# Patient Record
Sex: Female | Born: 1970
Health system: Southern US, Community
[De-identification: ages and names within clinical notes are randomized; demographics above are authoritative.]

## PROBLEM LIST (undated history)

## (undated) DIAGNOSIS — R0789 Other chest pain: Secondary | ICD-10-CM

## (undated) DIAGNOSIS — R519 Headache, unspecified: Secondary | ICD-10-CM

## (undated) DIAGNOSIS — G4730 Sleep apnea, unspecified: Secondary | ICD-10-CM

## (undated) DIAGNOSIS — E119 Type 2 diabetes mellitus without complications: Secondary | ICD-10-CM

## (undated) DIAGNOSIS — R5381 Other malaise: Secondary | ICD-10-CM

## (undated) DIAGNOSIS — T8859XA Other complications of anesthesia, initial encounter: Secondary | ICD-10-CM

## (undated) DIAGNOSIS — Z9889 Other specified postprocedural states: Secondary | ICD-10-CM

## (undated) DIAGNOSIS — R5383 Other fatigue: Secondary | ICD-10-CM

## (undated) DIAGNOSIS — M6208 Separation of muscle (nontraumatic), other site: Secondary | ICD-10-CM

## (undated) DIAGNOSIS — E785 Hyperlipidemia, unspecified: Secondary | ICD-10-CM

## (undated) DIAGNOSIS — F419 Anxiety disorder, unspecified: Secondary | ICD-10-CM

## (undated) DIAGNOSIS — F410 Panic disorder [episodic paroxysmal anxiety] without agoraphobia: Secondary | ICD-10-CM

## (undated) DIAGNOSIS — T4145XA Adverse effect of unspecified anesthetic, initial encounter: Secondary | ICD-10-CM

## (undated) DIAGNOSIS — R51 Headache: Secondary | ICD-10-CM

## (undated) DIAGNOSIS — C801 Malignant (primary) neoplasm, unspecified: Secondary | ICD-10-CM

## (undated) DIAGNOSIS — R112 Nausea with vomiting, unspecified: Secondary | ICD-10-CM

## (undated) DIAGNOSIS — E669 Obesity, unspecified: Secondary | ICD-10-CM

## (undated) DIAGNOSIS — I10 Essential (primary) hypertension: Secondary | ICD-10-CM

## (undated) DIAGNOSIS — M1990 Unspecified osteoarthritis, unspecified site: Secondary | ICD-10-CM

## (undated) DIAGNOSIS — R0683 Snoring: Secondary | ICD-10-CM

## (undated) DIAGNOSIS — E668 Other obesity: Secondary | ICD-10-CM

## (undated) HISTORY — DX: Headache: R51

## (undated) HISTORY — PX: KNEE SURGERY: SHX244

## (undated) HISTORY — DX: Other chest pain: R07.89

## (undated) HISTORY — DX: Other obesity: E66.8

## (undated) HISTORY — DX: Panic disorder (episodic paroxysmal anxiety): F41.0

## (undated) HISTORY — DX: Headache, unspecified: R51.9

## (undated) HISTORY — DX: Anxiety disorder, unspecified: F41.9

## (undated) HISTORY — DX: Hyperlipidemia, unspecified: E78.5

## (undated) HISTORY — DX: Unspecified osteoarthritis, unspecified site: M19.90

## (undated) HISTORY — DX: Other malaise: R53.81

## (undated) HISTORY — DX: Snoring: R06.83

## (undated) HISTORY — DX: Other malaise: R53.83

## (undated) HISTORY — PX: JOINT REPLACEMENT: SHX530

## (undated) HISTORY — DX: Obesity, unspecified: E66.9

## (undated) HISTORY — DX: Separation of muscle (nontraumatic), other site: M62.08

---

## 1998-11-25 ENCOUNTER — Other Ambulatory Visit: Admission: RE | Admit: 1998-11-25 | Discharge: 1998-11-25 | Payer: Self-pay | Admitting: Obstetrics and Gynecology

## 1999-11-18 ENCOUNTER — Other Ambulatory Visit: Admission: RE | Admit: 1999-11-18 | Discharge: 1999-11-18 | Payer: Self-pay | Admitting: Obstetrics and Gynecology

## 2000-12-12 ENCOUNTER — Other Ambulatory Visit: Admission: RE | Admit: 2000-12-12 | Discharge: 2000-12-12 | Payer: Self-pay | Admitting: Obstetrics and Gynecology

## 2001-04-30 ENCOUNTER — Encounter: Payer: Self-pay | Admitting: Obstetrics and Gynecology

## 2001-04-30 ENCOUNTER — Ambulatory Visit (HOSPITAL_COMMUNITY): Admission: RE | Admit: 2001-04-30 | Discharge: 2001-04-30 | Payer: Self-pay | Admitting: Obstetrics and Gynecology

## 2001-05-28 ENCOUNTER — Encounter: Payer: Self-pay | Admitting: Obstetrics and Gynecology

## 2001-05-28 ENCOUNTER — Ambulatory Visit (HOSPITAL_COMMUNITY): Admission: RE | Admit: 2001-05-28 | Discharge: 2001-05-28 | Payer: Self-pay | Admitting: Obstetrics and Gynecology

## 2001-09-26 ENCOUNTER — Inpatient Hospital Stay (HOSPITAL_COMMUNITY): Admission: AD | Admit: 2001-09-26 | Discharge: 2001-09-28 | Payer: Self-pay | Admitting: Obstetrics and Gynecology

## 2001-11-08 ENCOUNTER — Other Ambulatory Visit: Admission: RE | Admit: 2001-11-08 | Discharge: 2001-11-08 | Payer: Self-pay | Admitting: Obstetrics and Gynecology

## 2002-12-31 ENCOUNTER — Other Ambulatory Visit: Admission: RE | Admit: 2002-12-31 | Discharge: 2002-12-31 | Payer: Self-pay | Admitting: Obstetrics and Gynecology

## 2004-03-07 ENCOUNTER — Inpatient Hospital Stay (HOSPITAL_COMMUNITY): Admission: AD | Admit: 2004-03-07 | Discharge: 2004-03-09 | Payer: Self-pay | Admitting: Obstetrics and Gynecology

## 2004-04-18 ENCOUNTER — Other Ambulatory Visit: Admission: RE | Admit: 2004-04-18 | Discharge: 2004-04-18 | Payer: Self-pay | Admitting: Obstetrics and Gynecology

## 2005-05-23 ENCOUNTER — Other Ambulatory Visit: Admission: RE | Admit: 2005-05-23 | Discharge: 2005-05-23 | Payer: Self-pay | Admitting: Obstetrics and Gynecology

## 2005-09-25 ENCOUNTER — Emergency Department (HOSPITAL_COMMUNITY): Admission: EM | Admit: 2005-09-25 | Discharge: 2005-09-25 | Payer: Self-pay | Admitting: Emergency Medicine

## 2009-07-19 ENCOUNTER — Encounter (INDEPENDENT_AMBULATORY_CARE_PROVIDER_SITE_OTHER): Payer: Self-pay | Admitting: *Deleted

## 2009-08-23 ENCOUNTER — Ambulatory Visit: Payer: Self-pay | Admitting: Internal Medicine

## 2009-08-23 DIAGNOSIS — K219 Gastro-esophageal reflux disease without esophagitis: Secondary | ICD-10-CM | POA: Insufficient documentation

## 2009-08-23 DIAGNOSIS — R143 Flatulence: Secondary | ICD-10-CM

## 2009-08-23 DIAGNOSIS — R142 Eructation: Secondary | ICD-10-CM

## 2009-08-23 DIAGNOSIS — R141 Gas pain: Secondary | ICD-10-CM

## 2009-08-23 DIAGNOSIS — K5900 Constipation, unspecified: Secondary | ICD-10-CM | POA: Insufficient documentation

## 2009-08-23 DIAGNOSIS — R1084 Generalized abdominal pain: Secondary | ICD-10-CM

## 2009-08-25 ENCOUNTER — Telehealth: Payer: Self-pay | Admitting: Internal Medicine

## 2009-08-26 LAB — CONVERTED CEMR LAB
ALT: 30 units/L (ref 0–35)
AST: 20 units/L (ref 0–37)
Albumin: 4.2 g/dL (ref 3.5–5.2)
Alkaline Phosphatase: 66 units/L (ref 39–117)
BUN: 8 mg/dL (ref 6–23)
Basophils Absolute: 0 10*3/uL (ref 0.0–0.1)
Basophils Relative: 0.8 % (ref 0.0–3.0)
Bilirubin, Direct: 0.1 mg/dL (ref 0.0–0.3)
CO2: 29 meq/L (ref 19–32)
CRP, High Sensitivity: 3.7 (ref 0.00–5.00)
Calcium: 9.3 mg/dL (ref 8.4–10.5)
Chloride: 103 meq/L (ref 96–112)
Creatinine, Ser: 0.6 mg/dL (ref 0.4–1.2)
Eosinophils Absolute: 0.1 10*3/uL (ref 0.0–0.7)
Eosinophils Relative: 2.1 % (ref 0.0–5.0)
GFR calc non Af Amer: 117.4 mL/min (ref >60)
Glucose, Bld: 95 mg/dL (ref 70–99)
HCT: 38.5 % (ref 36.0–46.0)
Hemoglobin: 13.2 g/dL (ref 12.0–15.0)
Lymphocytes Relative: 35.6 % (ref 12.0–46.0)
Lymphs Abs: 2 10*3/uL (ref 0.7–4.0)
MCHC: 34.3 g/dL (ref 30.0–36.0)
MCV: 87.5 fL (ref 78.0–100.0)
Monocytes Absolute: 0.4 10*3/uL (ref 0.1–1.0)
Monocytes Relative: 6.4 % (ref 3.0–12.0)
Neutro Abs: 3 10*3/uL (ref 1.4–7.7)
Neutrophils Relative %: 55.1 % (ref 43.0–77.0)
Platelets: 167 10*3/uL (ref 150.0–400.0)
Potassium: 4.1 meq/L (ref 3.5–5.1)
RBC: 4.4 M/uL (ref 3.87–5.11)
RDW: 12.3 % (ref 11.5–14.6)
Sodium: 140 meq/L (ref 135–145)
TSH: 1.66 microintl units/mL (ref 0.35–5.50)
Total Bilirubin: 0.5 mg/dL (ref 0.3–1.2)
Total Protein: 7.1 g/dL (ref 6.0–8.3)
WBC: 5.5 10*3/uL (ref 4.5–10.5)

## 2009-09-21 ENCOUNTER — Ambulatory Visit: Payer: Self-pay | Admitting: Internal Medicine

## 2009-09-24 ENCOUNTER — Ambulatory Visit: Payer: Self-pay | Admitting: Internal Medicine

## 2009-09-24 LAB — CONVERTED CEMR LAB
Fecal Occult Blood: NEGATIVE
OCCULT 1: NEGATIVE
OCCULT 2: NEGATIVE
OCCULT 3: NEGATIVE
OCCULT 4: NEGATIVE
OCCULT 5: NEGATIVE

## 2009-09-28 LAB — CONVERTED CEMR LAB
Fecal Occult Blood: NEGATIVE
OCCULT 1: NEGATIVE
OCCULT 2: NEGATIVE
OCCULT 3: NEGATIVE
OCCULT 4: NEGATIVE
OCCULT 5: NEGATIVE

## 2010-06-28 ENCOUNTER — Ambulatory Visit (HOSPITAL_COMMUNITY): Admission: RE | Admit: 2010-06-28 | Discharge: 2010-06-28 | Payer: Self-pay | Admitting: Obstetrics and Gynecology

## 2010-10-20 NOTE — Assessment & Plan Note (Signed)
Summary: F/U APPT...bloating and constipation   History of Present Illness Visit Type: Follow-up Visit Primary GI MD: Yancey Flemings MD Primary Provider: Lupe Carney, MD Requesting Provider: n/a Chief Complaint: follow up bloating/constipation. Pt states she had less bloating while taking Align.  Constipation better on Miralax History of Present Illness:   40 year old female with a history of hyperlipidemia and anxiety disorder. She was evaluated August 23, 2009 regarding a number of abdominal complaints including generalized abdominal discomfort, bloating, constipation, and GERD. She underwent laboratory testing including comprehensive metabolic panel, CBC, and thyroid stimulating hormone. These were normal. She was educated with regards to intestinal gas. Probiotic align prescribed for 2 weeks. MiraLax initiated for constipation. On these therapies she reports significant improvement in symptoms. She has been on either therapy over the past 2 weeks and reports recurrence of symptoms. No new problems.   GI Review of Systems    Reports abdominal pain, acid reflux, bloating, and  weight gain.     Location of  Abdominal pain: generalized.    Denies belching, chest pain, dysphagia with liquids, dysphagia with solids, heartburn, loss of appetite, nausea, vomiting, vomiting blood, and  weight loss.      Reports constipation.      Current Medications (verified): 1)  Xanax Xr 0.5 Mg Xr24h-Tab (Alprazolam) .... One Tablet By Mouth Three Times A Day 2)  Simvastatin 80 Mg Tabs (Simvastatin) .... One Tablet By Mouth Once Daily  Allergies (verified): No Known Drug Allergies  Past History:  Past Medical History: Reviewed history from 08/23/2009 and no changes required. Anxiety Disorder Hyperlipidemia  Past Surgical History: Reviewed history from 08/23/2009 and no changes required. C-section x 1  Left Knee Surgery   Family History: Reviewed history from 08/23/2009 and no changes  required. No FH of Colon Cancer:  Social History: Reviewed history from 08/23/2009 and no changes required. Occupation: [employer] Married 3 childern Patient is a former smoker.  Alcohol Use - no Daily Caffeine Use: 4 cups of coffee daily  Illicit Drug Use - no  Review of Systems       unchanged from last visit  Vital Signs:  Patient profile:   40 year old female Height:      67 inches Weight:      218 pounds BMI:     34.27 Pulse rate:   76 / minute Pulse rhythm:   regular BP sitting:   118 / 76  (left arm) Cuff size:   large  Vitals Entered By: Francee Piccolo CMA Duncan Dull) (September 21, 2009 4:02 PM)  Physical Exam  General:  Well developed, well nourished, no acute distress. Head:  Normocephalic and atraumatic. Eyes:  PERRLA, no icterus. Nose:  No deformity, discharge,  or lesions. Mouth:  No deformity or lesions, dentition normal. Lungs:  Clear throughout to auscultation. Heart:  Regular rate and rhythm; no murmurs, rubs,  or bruits. Abdomen:  Soft, nontender and nondistended. No masses, hepatosplenomegaly or hernias noted. Normal bowel sounds. Pulses:  Normal pulses noted. Extremities:  No  edema or deformities noted. Neurologic:  Alert and  oriented x4;  Skin:  Intact without significant lesions or rashes. Psych:  Alert and cooperative. Normal mood and affect.   Impression & Recommendations:  Problem # 1:  CONSTIPATION (ICD-564.00) Functional constipation.  Plan: #1. Resume MiraLax 17 g in 8 ounces of water daily. Titrate to need. Use on demand.  Problem # 2:  FLATULENCE-GAS-BLOATING (ICD-787.3) ongoing. Okay to use probiotic align on demand p.r.n. Additional samples given  Problem # 3:  ABDOMINAL PAIN -GENERALIZED (ICD-789.07) abdominal discomfort secondary to constipation and bloating. Treatment as outlined above. Also recommend weight loss  Problem # 4:  GERD (ICD-530.81) mild. Would treat with reflux precautions and weight loss  Patient  Instructions: 1)  copy: Dr. Tracey Harries, Dr. Lupe Carney

## 2011-02-03 NOTE — Discharge Summary (Signed)
Terri, [name]                          ACCOUNT NO.:  [id number]   MEDICAL RECORD NO.:  [id number]                   PATIENT TYPE:  INP   LOCATION:  9106                                 FACILITY:  [facility]   PHYSICIAN:  Zenaida Niece, M.D.             DATE OF BIRTH:  03/27/1971   DATE OF ADMISSION:  03/07/2004  DATE OF DISCHARGE:  03/09/2004                                 DISCHARGE SUMMARY   ADMISSION DIAGNOSES:  1. Intrauterine pregnancy at 39 weeks.  2. Previous cesarean section.  3. Advanced maternal age.   DISCHARGE DIAGNOSES:  1. Intrauterine pregnancy at 39 weeks.  2. Previous cesarean section.  3. Advanced maternal age.   PROCEDURE:  On June 20 she had a VBAC.   HISTORY AND PHYSICAL:  This is a 40 year old white female, gravida 3, para 2-  0-0-2, with an EGA of [redacted] weeks by an LMP consistent with a nine-week  ultrasound with a due date of June 27, who presents for elective induction.  Prenatal care complicated by sinusitis treated at 20 and 26 weeks with  Augmentin.  Exposure to Parvovirus B19 with blood work consistent with prior  exposure.  She had a fetal echogenic intracardiac focus on 18-week  ultrasound with otherwise normal anatomy.  She also has one prior cesarean  section followed by VBAC and wants another VBAC and has advanced maternal  age and declined any testing for this other than ultrasound.   PRENATAL LABORATORY DATA:  Blood type A positive with a negative antibody  screen.  RPR nonreactive.  Rubella immune.  Hepatitis B surface antigen  negative.  HIV negative.  Gonorrhea and Chlamydia negative.  Triple screen  normal.  One-hour Glucola 123, group B strep is negative.   PAST OBSTETRICAL HISTORY:  In 1998, low transverse cesarean section at 40  weeks, 9 pounds 13 ounces, complicated by preeclampsia with HELLP syndrome.  In 2003, a vacuum-assisted VBAC at 39 weeks, 9 pound.   Remainder of her history is noncontributory.   PHYSICAL EXAMINATION:   VITAL SIGNS:  She is afebrile with stable vital  signs.  Fetal heart tracing reactive, with occasional contractions.  ABDOMEN:  Gravid, nontender, with a transverse scar and estimated fetal  weight of 8-1/2 pounds.  PELVIC:  Cervix is 3-4, 50, -2, vertex presentation, and amniotomy revealed  clear fluid.   HOSPITAL COURSE:  The patient was admitted and had amniotomy performed for  induction.  She then required Pitocin to enter active labor and received an  epidural.  She progressed to complete, pushed well, and on the afternoon of  June 20 had a vaginal delivery via VBAC of a viable female infant with  Apgars of 9 and 9 that weighed 8 pounds 6 ounces.  The placenta delivered  spontaneous, was intact.  The perineum was intact, and estimated blood loss  was less than 500 mL.  Postpartum she had no significant  complications.  On  postpartum day #2 she was stable for discharge home.   DISCHARGE INSTRUCTIONS:  1. Regular diet.  2. Pelvic rest.  3. Follow up in six weeks.  4. Medications are over-the-counter ibuprofen as needed, and she is given     our discharge pamphlet.                                               Zenaida Niece, M.D.    TDM/MEDQ  D:  03/09/2004  T:  03/11/2004  Job:  [id number]

## 2011-02-03 NOTE — Op Note (Signed)
[facility]  Patient:    MEYLI, BOICE Visit Number: [id number] MRN: [id number]          Service Type: OBS Location: 910A 9121 01 Attending Physician:  Michaele Offer Dictated by:   Zenaida Niece, M.D. Proc. Date: 09/26/01 Admit Date:  09/26/2001                             Operative Report  PROCEDURE:                    Vacuum-assisted vaginal birth after cesarean section.  SURGEON:                      Zenaida Niece, M.D.  PROCEDURE IN DETAIL:          The patient reached completely dilated, pushed well and brought the vertex to approximately +2 station, and became exhausted. She did have an epidural with some hot spots.  Her epidural was dosed with a perineal dose.  The risks of assisted delivery were discussed with the patient and she agreed to proceed.  On vaginal exam, the vertex was at +2 station in an ROA position with some asynclitism.  I elected to proceed with vacuum assistance.  The bladder was drained with a red rubber catheter.  A perineal dose of epidural anesthesia achieved adequate anesthesia.  A self-contained mushroom-cup vacuum was applied to the vertex and, with three contractions, the vertex was brought to the perineum.  The patient developed vomiting at this point and, with vomiting, the vertex delivered.  The vacuum was removed and the babys mouth and nares were suctioned.  The remainder of the infant then delivered with some difficulty, but no significant shoulder dystocia. This was a viable female infant with Apgars of 9 and 9 with the weight pending at the time of dictation.  The placenta then delivered spontaneously and intact.  She had bilateral vaginal sulcus tears to the labia, and these were repaired with 3-0 Vicryl with adequate hemostasis.  Estimated blood loss was approximately 700 cc.  At the end of the procedure, mother and baby were doing very well. Dictated by:   Zenaida Niece,  M.D. Attending Physician:  Michaele Offer DD:  09/26/01 TD:  09/27/01 Job: [id number]

## 2011-02-03 NOTE — Discharge Summary (Signed)
[facility] of [facility]  Patient:    DARBIE, BIANCARDI Visit Number: [id number] MRN: [id number]          Service Type: OBS Location: 910A 9121 01 Attending Physician:  Michaele Offer Dictated by:   Alvino Chapel, M.D. Admit Date:  09/26/2001 Discharge Date: 09/28/2001                             Discharge Summary  DISCHARGE DIAGNOSES:          1. Intrauterine pregnancy at 39 weeks,                                  delivered.                               2. Prior cesarean section, low transverse.                               3. Status post vacuum-assisted vaginal birth                                  after cesarean section.  DISCHARGE MEDICATIONS:        1. Percocet 1-2 tablets p.o. every 4 hours                                  p.r.n.                               2. Motrin 600 mg p.o. q.6h.  DISCHARGE FOLLOW-UP:          The patient is to follow up in six weeks for her routine postpartum exam.  HOSPITAL COURSE:              The patient is a 40 year old G2, P1-0-0-1, who is admitted at 39+ weeks for induction, given a favorable cervix.  Prenatal care had been complicated by a prior low transverse cesarean section and she was cleared for a trial of labor, otherwise uncomplicated.  LABORATORY/ACCESSORY DATA:    Prenatal labs are as follows: A-positive.  RPR nonreactive.  Rubella equivocal.  Hepatitis B surface antigen negative.  HIV negative.  GC and chlamydia negative.  Triple screen normal.  Group B Strep negative.  One-hour glucola was elevated.  Three-hour glucola was within normal limits.  PAST OBSTETRICAL HISTORY:     In 1998, she had a low transverse cesarean section at 40 weeks for a 9 pound 13 ounce infant for arrest of descent.  She also had preeclampsia with HELLP syndrome with that pregnancy.  PAST SURGICAL HISTORY:        Cesarean section only.  MEDICAL HISTORY:              None.  HOSPITAL COURSE:              On  admission, she was afebrile.  Blood pressure was 138/88.  Fetal heart rate was reactive.  EFW was 8-1/2 pounds.  On vaginal exam, cervix was 4/70/-1.  The patient was begun on IV Pitocin and sometime later, I  ruptured her membranes.  She reached complete dilation and pushed for approximately three hours, brought the vertex to a +2 station and a vacuum-assisted vaginal birth after cesarean section was performed.  A viable infant was delivered.  Apgars were 9 and 9.  Weight was 9 pounds even. Placenta was delivered spontaneously and intact.  There were bilateral sulcal lacerations, repaired with 3-0 Vicryl.  The patient was then admitted for routine postpartum care.  She did well.  On postpartum day #2, she was afebrile for greater than 12 hours and was doing well, therefore, she was felt stable for discharge home with follow up as previously stated. Dictated by:   Alvino Chapel, M.D. Attending Physician:  Michaele Offer DD:  09/28/01 TD:  09/29/01 Job: [id number]

## 2011-05-03 ENCOUNTER — Encounter (INDEPENDENT_AMBULATORY_CARE_PROVIDER_SITE_OTHER): Payer: Self-pay | Admitting: Family Medicine

## 2011-05-08 ENCOUNTER — Encounter (INDEPENDENT_AMBULATORY_CARE_PROVIDER_SITE_OTHER): Payer: Self-pay | Admitting: General Surgery

## 2011-05-08 ENCOUNTER — Ambulatory Visit (INDEPENDENT_AMBULATORY_CARE_PROVIDER_SITE_OTHER): Payer: 59 | Admitting: General Surgery

## 2011-05-08 VITALS — BP 124/86 | HR 62 | Temp 97.4°F | Ht 67.0 in | Wt 202.2 lb

## 2011-05-08 DIAGNOSIS — M6200 Separation of muscle (nontraumatic), unspecified site: Secondary | ICD-10-CM

## 2011-05-08 DIAGNOSIS — M6208 Separation of muscle (nontraumatic), other site: Secondary | ICD-10-CM | POA: Insufficient documentation

## 2011-05-08 NOTE — Progress Notes (Signed)
Subjective:     Patient ID: Terri [name], female   DOB: [birth year], 40 y.o.   MRN: [id number]  HPI This is a 40 year old female who has noted a lump from her umbilicus to around her xiphoid for over a year. She noticed that this lump goes in and out at times. She has noticed that this did for her after she eats certain things. It goes away when she is lying down and is present when she is sitting up or standing. She saw Dr. Marina Goodell last year for an evaluation for some multiple gastrointestinal complaints. This area has gotten worse over the last year. She also has had worsening of her bowel movements as well as a number of other gastrointestinal complaints. She is unable to eat a variety of different foods at this point. She has some occasional abdominal cramping as well as bloating. She also appears to alternate between some loose stools as well as constipation. This area is aggravated by certain foods and is relieved by lying down.  Review of Systems  Constitutional: Positive for fatigue and unexpected weight change.  HENT: Negative.   Eyes: Negative.   Respiratory: Negative.   Cardiovascular: Negative.   Gastrointestinal: Positive for abdominal pain, diarrhea and constipation.  Genitourinary: Negative.   Musculoskeletal: Negative.   Skin: Negative.   Neurological: Negative.   Hematological: Negative.   Psychiatric/Behavioral: Negative.        Objective:   Physical Exam  Constitutional: She appears well-developed and well-nourished.  Neck: Neck supple.  Cardiovascular: Normal rate and regular rhythm.   Pulmonary/Chest: Effort normal and breath sounds normal. She has no wheezes. She has no rales.  Abdominal: Soft. Bowel sounds are normal. There is no tenderness.       She does not have umbilical hernia, appears on exam to have a diastasis recti present from subxyphoid region to umbilicus  Lymphadenopathy:    She has no cervical adenopathy.       Assessment:     Diastasis recti      Plan:     This area I think is a diastasis recti. We discussed the role of a CT scan to prove this but I don't think that that is necessary with her. She has not had prior surgery in this region. Her history with 3 pregnancies as well as her exam consistent with rectus muscles are spread open is certainly consistent with the diastases recti. We discussed the etiology of the diastases recti as well as that there is not a surgical repair from my standpoint. There is no risk to this as it is not a hernia. I think that many of her symptoms would improve if she has her gastrointestinal complaints evaluated and regulated a little bit more as Dr. Prince Rome has started trying to do. We discussed this at length today and asked her to call me back if she has any further further questions.

## 2011-10-21 ENCOUNTER — Ambulatory Visit (INDEPENDENT_AMBULATORY_CARE_PROVIDER_SITE_OTHER): Payer: 59 | Admitting: Family Medicine

## 2011-10-21 DIAGNOSIS — J310 Chronic rhinitis: Secondary | ICD-10-CM

## 2011-10-21 DIAGNOSIS — J329 Chronic sinusitis, unspecified: Secondary | ICD-10-CM

## 2011-10-21 MED ORDER — FLUTICASONE PROPIONATE 50 MCG/ACT NA SUSP: 1.0000 | Freq: Every day | NASAL | Status: DC

## 2011-10-21 MED ORDER — AZITHROMYCIN 250 MG PO TABS: ORAL_TABLET | ORAL | Status: AC

## 2011-10-21 NOTE — Progress Notes (Signed)
  Subjective:    Patient ID: Terri [name], female    DOB: 05-May-1971, 41 y.o.   MRN: [id number]  Sinusitis This is a new problem. The current episode started 1 to 4 weeks ago. The problem has been gradually worsening since onset. There has been no fever. Associated symptoms include chills, congestion, headaches, neck pain and sinus pressure. Pertinent negatives include no coughing, hoarse voice, shortness of breath, sneezing, sore throat or swollen glands. Past treatments include saline sprays. The treatment provided no relief.      Review of Systems  Constitutional: Positive for chills.  HENT: Positive for congestion, neck pain and sinus pressure. Negative for sore throat, hoarse voice and sneezing.   Respiratory: Negative for cough and shortness of breath.   Neurological: Positive for headaches.       Objective:   Physical Exam  Constitutional: She is oriented to person, place, and time. She appears well-developed and well-nourished.  HENT:  Head: Normocephalic and atraumatic.  Right Ear: External ear normal.  Left Ear: External ear normal.  Mouth/Throat: Oropharynx is clear and moist. No oropharyngeal exudate.  Eyes: EOM are normal. Pupils are equal, round, and reactive to light.  Neck: Normal range of motion. Neck supple. No tracheal deviation present. No thyromegaly present.  Cardiovascular: Normal rate, regular rhythm and normal heart sounds.  Exam reveals no gallop and no friction rub.   No murmur heard. Pulmonary/Chest: Effort normal and breath sounds normal.  Abdominal: Soft. Bowel sounds are normal.  Musculoskeletal: Normal range of motion.  Lymphadenopathy:    She has no cervical adenopathy.  Neurological: She is alert and oriented to person, place, and time.  Skin: Skin is warm and dry. No rash noted.  Psychiatric: She has a normal mood and affect.   + max sinus tenderness, erythematous nasal passage bilaterally       Assessment & Plan:  1.sinusitis-z pac and sx  treatment 2. Rhinitis-flonase and sx treatment 3. F/u prn, patient declines AVS

## 2011-10-22 NOTE — Patient Instructions (Addendum)

## 2011-11-05 ENCOUNTER — Encounter (HOSPITAL_COMMUNITY): Payer: Self-pay

## 2011-11-05 ENCOUNTER — Emergency Department (HOSPITAL_COMMUNITY)
Admission: EM | Admit: 2011-11-05 | Discharge: 2011-11-05 | Disposition: A | Discharge status: Home / Self Care | Payer: 59 | Attending: Emergency Medicine | Admitting: Emergency Medicine

## 2011-11-05 ENCOUNTER — Emergency Department (HOSPITAL_COMMUNITY): Payer: 59

## 2011-11-05 DIAGNOSIS — F411 Generalized anxiety disorder: Secondary | ICD-10-CM | POA: Insufficient documentation

## 2011-11-05 DIAGNOSIS — IMO0001 Reserved for inherently not codable concepts without codable children: Secondary | ICD-10-CM | POA: Insufficient documentation

## 2011-11-05 DIAGNOSIS — M533 Sacrococcygeal disorders, not elsewhere classified: Secondary | ICD-10-CM | POA: Insufficient documentation

## 2011-11-05 DIAGNOSIS — E785 Hyperlipidemia, unspecified: Secondary | ICD-10-CM | POA: Insufficient documentation

## 2011-11-05 DIAGNOSIS — Z79899 Other long term (current) drug therapy: Secondary | ICD-10-CM | POA: Insufficient documentation

## 2011-11-05 DIAGNOSIS — M129 Arthropathy, unspecified: Secondary | ICD-10-CM | POA: Insufficient documentation

## 2011-11-05 DIAGNOSIS — M545 Low back pain, unspecified: Secondary | ICD-10-CM | POA: Insufficient documentation

## 2011-11-05 DIAGNOSIS — M25559 Pain in unspecified hip: Secondary | ICD-10-CM | POA: Insufficient documentation

## 2011-11-05 DIAGNOSIS — N390 Urinary tract infection, site not specified: Secondary | ICD-10-CM

## 2011-11-05 DIAGNOSIS — R1110 Vomiting, unspecified: Secondary | ICD-10-CM | POA: Insufficient documentation

## 2011-11-05 LAB — URINALYSIS, ROUTINE W REFLEX MICROSCOPIC
Bilirubin Urine: NEGATIVE
Nitrite: NEGATIVE
Specific Gravity, Urine: 1.031 — ABNORMAL HIGH (ref 1.005–1.030)
Urobilinogen, UA: 0.2 mg/dL (ref 0.0–1.0)
pH: 6.5 (ref 5.0–8.0)

## 2011-11-05 LAB — URINE MICROSCOPIC-ADD ON

## 2011-11-05 MED ORDER — NITROFURANTOIN MONOHYD MACRO 100 MG PO CAPS: 100.0000 mg | ORAL_CAPSULE | Freq: Two times a day (BID) | ORAL | Status: DC

## 2011-11-05 MED ORDER — ONDANSETRON 4 MG PO TBDP: 4.0000 mg | ORAL_TABLET | Freq: Three times a day (TID) | ORAL | Status: AC | PRN

## 2011-11-05 MED ORDER — OXYCODONE-ACETAMINOPHEN 5-325 MG PO TABS
1.0000 | ORAL_TABLET | Freq: Once | ORAL | Status: AC
  Administered 2011-11-05: 1 via ORAL
  Filled 2011-11-05: qty 1

## 2011-11-05 MED ORDER — HYDROCODONE-ACETAMINOPHEN 5-325 MG PO TABS: 1.0000 | ORAL_TABLET | ORAL | Status: AC | PRN

## 2011-11-05 MED ORDER — CIPROFLOXACIN HCL 250 MG PO TABS: 250.0000 mg | ORAL_TABLET | Freq: Two times a day (BID) | ORAL | Status: AC

## 2011-11-05 NOTE — ED Provider Notes (Signed)
History     CSN: [id number]  Arrival date & time 11/05/11  1815   First MD Initiated Contact with Patient 11/05/11 1941      No chief complaint on file.   (Consider location/radiation/quality/duration/timing/severity/associated sxs/prior treatment) Patient is a 41 y.o. female presenting with back pain. The history is provided by the patient.  Back Pain  This is a new problem. The current episode started 12 to 24 hours ago. The problem occurs constantly. The problem has been gradually worsening. The pain is associated with no known injury. The pain is present in the lumbar spine, sacro-iliac joint and gluteal region. The quality of the pain is described as shooting. The pain does not radiate. The pain is severe. The symptoms are aggravated by certain positions and bending. The pain is the same all the time. Pertinent negatives include no fever, no numbness, no headaches, no abdominal pain, no bowel incontinence, no perianal numbness, no bladder incontinence, no dysuria, no pelvic pain, no paresthesias, no tingling and no weakness. She has tried bed rest for the symptoms. The treatment provided no relief.   Pt with no hx back pain awoke this AM with pain to L lower back radiating into gluteal region. States it is painful with ambulation. Denies fever, chills. She began to have vomiting this afternoon. Denies any abd pain, diarrhea. Has never had anything like this before. No known injury.  Past Medical History  Diagnosis Date  . Hyperlipidemia   . Anxiety   . Weight increase   . Problems related to lack of adequate sleep     fluctuation between not falling asleep and then not being able to get out of bed  . Arthritis     knee pain  . Diastasis recti     Past Surgical History  Procedure Date  . Knee surgery     bilateral meniscus  . Cesarean section 1998    Family History  Problem Relation Age of Onset  . Hypertension Mother   . Hyperlipidemia Father   . Diabetes Father   .  Stroke Father     2011    History  Substance Use Topics  . Smoking status: Former Smoker    Quit date: 02/17/1996  . Smokeless tobacco: Not on file  . Alcohol Use: Yes  denies IVDU  OB History    Grav Para Term Preterm Abortions TAB SAB Ect Mult Living                  Review of Systems  Constitutional: Negative for fever, chills, appetite change and fatigue.  HENT: Negative.   Respiratory: Negative.   Cardiovascular: Negative.   Gastrointestinal: Positive for vomiting. Negative for abdominal pain, diarrhea and bowel incontinence.  Genitourinary: Negative for bladder incontinence, dysuria, decreased urine volume, vaginal discharge and pelvic pain.  Musculoskeletal: Positive for back pain.  Skin: Negative.   Neurological: Negative for tingling, weakness, numbness, headaches and paresthesias.    Allergies  Review of patient's allergies indicates no known allergies.  Home Medications   Current Outpatient Rx  Name Route Sig Dispense Refill  . XANAX PO Oral Take 0.25 mg by mouth 3 (three) times daily.      Marland Kitchen FLUTICASONE PROPIONATE 50 MCG/ACT NA SUSP Nasal Place 1 spray into the nose daily. 16 g 0  . IBUPROFEN 800 MG PO TABS Oral Take 800 mg by mouth every 8 (eight) hours as needed.    . MELOXICAM 15 MG PO TABS Oral Take 15 mg by  mouth daily.    . VENLAFAXINE HCL ER 75 MG PO CP24 Oral Take 75 mg by mouth daily.      BP 153/93  Pulse 84  Temp(Src) 97.7 F (36.5 C) (Oral)  Resp 18  Ht 5\' 7"  (1.702 m)  Wt 194 lb (87.998 kg)  BMI 30.38 kg/m2  SpO2 97%  Physical Exam  Nursing note and vitals reviewed. Constitutional: She is oriented to person, place, and time. She appears well-developed and well-nourished. No distress.  HENT:  Head: Normocephalic and atraumatic.  Neck: Normal range of motion.  Cardiovascular: Normal rate, regular rhythm and normal heart sounds.   Pulmonary/Chest: Effort normal and breath sounds normal. She exhibits no tenderness.  Abdominal: Soft.  Bowel sounds are normal. There is no tenderness. There is no rebound and no guarding.       +CVA tenderness L  Musculoskeletal: Normal range of motion. She exhibits no edema and no tenderness.       Spine: No palpable stepoff, crepitus, or gross deformity appreciated. No midline tenderness. No appreciable spasm of paravertebral muscles.  Tenderness to palp over L lateral low back. Negative straight leg raise.   Neurological: She is alert and oriented to person, place, and time.       Sensation intact bilaterally. Knee, ankle DTRs 2+ b/l. Full strength to resistance b/l. Pt ambulates with nl gait.  Skin: Skin is warm and dry. She is not diaphoretic.  Psychiatric: She has a normal mood and affect.    ED Course  Procedures (including critical care time)  Labs Reviewed  URINALYSIS, ROUTINE W REFLEX MICROSCOPIC - Abnormal; Notable for the following:    APPearance TURBID (*)    Specific Gravity, Urine 1.031 (*)    Ketones, ur 15 (*)    Protein, ur 30 (*)    Leukocytes, UA MODERATE (*)    All other components within normal limits  URINE MICROSCOPIC-ADD ON - Abnormal; Notable for the following:    Squamous Epithelial / LPF MANY (*)    Bacteria, UA MANY (*)    All other components within normal limits  PREGNANCY, URINE  URINE CULTURE   Dg Lumbar Spine Complete  11/05/2011  *RADIOLOGY REPORT*  Clinical Data: Lower back pain, radiating into the left hip.  LUMBAR SPINE - COMPLETE 4+ VIEW  Comparison: None.  Findings: There is no evidence of fracture or subluxation. Vertebral bodies demonstrate normal height and alignment. Intervertebral disc spaces are preserved.  The visualized neural foramina are grossly unremarkable in appearance.  The visualized bowel gas pattern is unremarkable in appearance; air and stool are noted within the colon.  The sacroiliac joints are within normal limits.  An intrauterine device is noted at the pelvis.  IMPRESSION: No evidence of fracture or subluxation along the  lumbar spine.  Original Report Authenticated By: Tonia Ghent, M.D.   Dg Hip Complete Left  11/05/2011  *RADIOLOGY REPORT*  Clinical Data: Lower back pain and left hip pain.  LEFT HIP - COMPLETE 2+ VIEW  Comparison: None.  Findings: There is no evidence of fracture or dislocation.  Both femoral heads are seated normally within their respective acetabula.  The proximal left femur appears intact.  No significant degenerative change is appreciated.  The sacroiliac joints are unremarkable in appearance.  The visualized bowel gas pattern is grossly unremarkable in appearance.  An intrauterine device is noted at the pelvis.  IMPRESSION: No evidence of fracture or dislocation.  Original Report Authenticated By: Tonia Ghent, M.D.  1. Urinary tract infection       MDM  Pt with back pain which began this AM. She has no "red flags" for back pain. Pain does not seem to radiate into leg. Plain films unremarkable. UA, although contaminated with squamous cells, has 21-50 WBCs - will plan to go ahead and tx as UTI. She was given rx for abx, antiemetic, pain meds. Return precautions discussed.        Grant Fontana, [place] 11/06/11 1256

## 2011-11-05 NOTE — ED Notes (Signed)
Patient reports that she woke up with lower backpain and pain to left hip, denies injury-ambulatory

## 2011-11-05 NOTE — Discharge Instructions (Signed)
Your x-rays appeared normal. However, your urine shows a likely urinary tract infection. You have been given a prescription for an antibiotic. Please take all of this as prescribed. You have additionally been given a prescription for nausea medicine. It is important to drink extra fluids for the next several days and stay well-hydrated. If you're having increased vomiting, or unable to keep down fluids or medicine, or if you have any other worrisome symptoms, please return to the ER for a recheck.  Urinary Tract Infection Infections of the urinary tract can start in several places. A bladder infection (cystitis), a kidney infection (pyelonephritis), and a prostate infection (prostatitis) are different types of urinary tract infections (UTIs). They usually get better if treated with medicines (antibiotics) that kill germs. Take all the medicine until it is gone. You or your child may feel better in a few days, but TAKE ALL MEDICINE or the infection may not respond and may become more difficult to treat. HOME CARE INSTRUCTIONS   Drink enough water and fluids to keep the urine clear or pale yellow. Cranberry juice is especially recommended, in addition to large amounts of water.   Avoid caffeine, tea, and carbonated beverages. They tend to irritate the bladder.   Alcohol may irritate the prostate.   Only take over-the-counter or prescription medicines for pain, discomfort, or fever as directed by your caregiver.  To prevent further infections:  Empty the bladder often. Avoid holding urine for long periods of time.   After a bowel movement, women should cleanse from front to back. Use each tissue only once.   Empty the bladder before and after sexual intercourse.  FINDING OUT THE RESULTS OF YOUR TEST Not all test results are available during your visit. If your or your child's test results are not back during the visit, make an appointment with your caregiver to find out the results. Do not assume  everything is normal if you have not heard from your caregiver or the medical facility. It is important for you to follow up on all test results. SEEK MEDICAL CARE IF:   There is back pain.   Your baby is older than 3 months with a rectal temperature of 100.5 F (38.1 C) or higher for more than 1 day.   Your or your child's problems (symptoms) are no better in 3 days. Return sooner if you or your child is getting worse.  SEEK IMMEDIATE MEDICAL CARE IF:   There is severe back pain or lower abdominal pain.   You or your child develops chills.   You have a fever.   Your baby is older than 3 months with a rectal temperature of 102 F (38.9 C) or higher.   Your baby is 64 months old or younger with a rectal temperature of 100.4 F (38 C) or higher.   There is nausea or vomiting.   There is continued burning or discomfort with urination.  MAKE SURE YOU:   Understand these instructions.   Will watch your condition.   Will get help right away if you are not doing well or get worse.  Document Released: 06/14/2005 Document Revised: 05/17/2011 Document Reviewed: 01/17/2007 [facility] Patient Information 2012 [place], [place].

## 2011-11-06 NOTE — ED Provider Notes (Signed)
Medical screening examination/treatment/procedure(s) were performed by non-physician practitioner and as supervising physician I was immediately available for consultation/collaboration.   Jaquanna Ballentine, MD 11/06/11 2253 

## 2011-11-07 LAB — URINE CULTURE: Colony Count: 7000

## 2012-03-17 ENCOUNTER — Ambulatory Visit: Payer: 59 | Admitting: Internal Medicine

## 2012-03-17 VITALS — BP 120/85 | HR 93 | Temp 98.5°F | Resp 18 | Ht 66.5 in | Wt 208.0 lb

## 2012-03-17 DIAGNOSIS — Z6833 Body mass index (BMI) 33.0-33.9, adult: Secondary | ICD-10-CM | POA: Insufficient documentation

## 2012-03-17 DIAGNOSIS — J329 Chronic sinusitis, unspecified: Secondary | ICD-10-CM

## 2012-03-17 DIAGNOSIS — R05 Cough: Secondary | ICD-10-CM

## 2012-03-17 MED ORDER — AMOXICILLIN 500 MG PO CAPS: 1000.0000 mg | ORAL_CAPSULE | Freq: Two times a day (BID) | ORAL | Status: AC

## 2012-03-17 MED ORDER — HYDROCODONE-HOMATROPINE 5-1.5 MG/5ML PO SYRP: 5.0000 mL | ORAL_SOLUTION | Freq: Three times a day (TID) | ORAL | Status: AC | PRN

## 2012-03-17 NOTE — Progress Notes (Signed)
  Subjective:    Patient ID: Terri [name], female    DOB: 1971-05-19, 41 y.o.   MRN: [id number]  HPI Four-day history of nasal congestion with copious postnasal drainage, sinus pressure headaches, nonproductive cough with cough affecting sleep at night No fever No sore throat No recent allergic problems No recent illnesses   Review of Systems     Objective:   Physical Exam  Vital signs normal except BMI elevated No conjunctival injection/TMs clear Nares boggy/maxillary areas tender to percussion Throat clear No nodes Lungs clear      Assessment & Plan:  Problem #1 sinusitis with cough Amoxicillin 502 tablets twice a day for 10 days Over-the-counter decongestants Hycodan for sleep

## 2013-09-15 ENCOUNTER — Encounter (HOSPITAL_COMMUNITY): Payer: Self-pay | Admitting: Pharmacy Technician

## 2013-09-15 ENCOUNTER — Other Ambulatory Visit: Payer: Self-pay | Admitting: Orthopedic Surgery

## 2013-09-22 ENCOUNTER — Encounter (HOSPITAL_COMMUNITY)
Admission: RE | Admit: 2013-09-22 | Discharge: 2013-09-22 | Disposition: A | Discharge status: Home / Self Care | Payer: 59 | Source: Ambulatory Visit | Attending: Orthopedic Surgery | Admitting: Orthopedic Surgery

## 2013-09-22 ENCOUNTER — Encounter (HOSPITAL_COMMUNITY): Payer: Self-pay

## 2013-09-22 DIAGNOSIS — Z01812 Encounter for preprocedural laboratory examination: Secondary | ICD-10-CM | POA: Insufficient documentation

## 2013-09-22 DIAGNOSIS — Z01818 Encounter for other preprocedural examination: Secondary | ICD-10-CM | POA: Insufficient documentation

## 2013-09-22 HISTORY — DX: Type 2 diabetes mellitus without complications: E11.9

## 2013-09-22 HISTORY — DX: Adverse effect of unspecified anesthetic, initial encounter: T41.45XA

## 2013-09-22 HISTORY — DX: Other complications of anesthesia, initial encounter: T88.59XA

## 2013-09-22 LAB — URINALYSIS, ROUTINE W REFLEX MICROSCOPIC
BILIRUBIN URINE: NEGATIVE
Glucose, UA: NEGATIVE mg/dL
HGB URINE DIPSTICK: NEGATIVE
KETONES UR: NEGATIVE mg/dL
NITRITE: NEGATIVE
PH: 5.5 (ref 5.0–8.0)
Protein, ur: 100 mg/dL — AB
Specific Gravity, Urine: 1.017 (ref 1.005–1.030)
Urobilinogen, UA: 0.2 mg/dL (ref 0.0–1.0)

## 2013-09-22 LAB — URINE MICROSCOPIC-ADD ON

## 2013-09-22 LAB — CBC
HCT: 40.7 % (ref 36.0–46.0)
Hemoglobin: 13.5 g/dL (ref 12.0–15.0)
MCH: 27.8 pg (ref 26.0–34.0)
MCHC: 33.2 g/dL (ref 30.0–36.0)
MCV: 83.7 fL (ref 78.0–100.0)
PLATELETS: 193 10*3/uL (ref 150–400)
RBC: 4.86 MIL/uL (ref 3.87–5.11)
RDW: 13.3 % (ref 11.5–15.5)
WBC: 6.4 10*3/uL (ref 4.0–10.5)

## 2013-09-22 LAB — APTT: aPTT: 29 seconds (ref 24–37)

## 2013-09-22 LAB — ABO/RH: ABO/RH(D): A POS

## 2013-09-22 LAB — PROTIME-INR
INR: 0.88 (ref 0.00–1.49)
Prothrombin Time: 11.8 seconds (ref 11.6–15.2)

## 2013-09-22 LAB — COMPREHENSIVE METABOLIC PANEL
ALK PHOS: 92 U/L (ref 39–117)
ALT: 69 U/L — ABNORMAL HIGH (ref 0–35)
AST: 30 U/L (ref 0–37)
Albumin: 4.4 g/dL (ref 3.5–5.2)
BILIRUBIN TOTAL: 0.3 mg/dL (ref 0.3–1.2)
BUN: 9 mg/dL (ref 6–23)
CHLORIDE: 99 meq/L (ref 96–112)
CO2: 25 mEq/L (ref 19–32)
CREATININE: 0.44 mg/dL — AB (ref 0.50–1.10)
Calcium: 10 mg/dL (ref 8.4–10.5)
GFR calc Af Amer: >90 mL/min (ref >90)
GFR calc non Af Amer: >90 mL/min (ref >90)
Glucose, Bld: 147 mg/dL — ABNORMAL HIGH (ref 70–99)
POTASSIUM: 4.2 meq/L (ref 3.7–5.3)
Sodium: 138 mEq/L (ref 137–147)
Total Protein: 7.5 g/dL (ref 6.0–8.3)

## 2013-09-22 LAB — HCG, SERUM, QUALITATIVE: PREG SERUM: NEGATIVE

## 2013-09-22 LAB — SURGICAL PCR SCREEN
MRSA, PCR: NEGATIVE
STAPHYLOCOCCUS AUREUS: NEGATIVE

## 2013-09-22 NOTE — Patient Instructions (Addendum)
Hillari Zumwalt  [id number]                           YOUR PROCEDURE IS SCHEDULED ON: 09/29/13               PLEASE REPORT TO SHORT STAY CENTER AT : 9:45 AM               CALL THIS NUMBER IF ANY PROBLEMS THE DAY OF SURGERY :               [phone]                      REMEMBER:   Do not eat food or drink liquids AFTER MIDNIGHT  May have clear liquids UNTIL 6 HOURS BEFORE SURGERY 6:45 AM  Clear liquids include soda, tea, black coffee, apple or grape juice, broth.  Take these medicines the morning of surgery with A SIP OF WATER: CLONAZEPAM   Do not wear jewelry, make-up   Do not wear lotions, powders, or perfumes.   Do not shave legs or underarms 12 hrs. before surgery (men may shave face)  Do not bring valuables to the hospital.  Contacts, dentures or bridgework may not be worn into surgery.  Leave suitcase in the car. After surgery it may be brought to your room.  For patients admitted to the hospital more than one night, checkout time is 11:00                          The day of discharge.   Patients discharged the day of surgery will not be allowed to drive home                             If going home same day of surgery, must have someone stay with you first                           24 hrs at home and arrange for some one to drive you home from hospital.    Special Instructions:   Please read over the following fact sheets that you were given:               1. MRSA  INFORMATION                      2. [place]               3. [place]                                                X_____________________________________________________________________        Failure to follow these instructions may result in cancellation of your surgery

## 2013-09-23 LAB — URINE CULTURE

## 2013-09-26 NOTE — Progress Notes (Signed)
Pt notified of time change to 1:45 pm to arrive at Short Stay at 10:45 am

## 2013-09-28 ENCOUNTER — Other Ambulatory Visit: Payer: Self-pay | Admitting: Surgical

## 2013-09-28 NOTE — H&P (Signed)
TOTAL KNEE ADMISSION H&P  Patient is being admitted for right total knee arthroplasty.  Subjective:  Chief Complaint:right knee pain.  HPI: Terri [name], 43 y.o. female, has a history of pain and functional disability in the right knee due to arthritis and has failed non-surgical conservative treatments for greater than 12 weeks to includeNSAID's and/or analgesics, corticosteriod injections, viscosupplementation injections and activity modification.  Onset of symptoms was gradual, starting 3 years ago with gradually worsening course since that time. The patient noted prior procedures on the knee to include  arthroscopy and menisectomy on the right knee(s).  Patient currently rates pain in the right knee(s) at 6 out of 10 with activity. Patient has night pain, worsening of pain with activity and weight bearing, pain that interferes with activities of daily living, pain with passive range of motion, crepitus and joint swelling.  Patient has evidence of periarticular osteophytes and joint space narrowing by imaging studies.  There is no active infection.  Patient Active Problem List   Diagnosis Date Noted  . BMI 33.0-33.9,adult 03/17/2012  . Diastasis recti 05/08/2011  . GERD 08/23/2009  . CONSTIPATION 08/23/2009  . FLATULENCE-GAS-BLOATING 08/23/2009  . ABDOMINAL PAIN -GENERALIZED 08/23/2009   Past Medical History  Diagnosis Date  . Hyperlipidemia   . Anxiety   . Arthritis     knee pain  . Complication of anesthesia     SLIGHT NAUSEA  . Diabetes mellitus without complication   . Diastasis recti     Past Surgical History  Procedure Laterality Date  . Knee surgery      bilateral meniscus  . Cesarean section  1998     Current outpatient prescriptions: aspirin EC 81 MG tablet, Take 81 mg by mouth daily., Disp: , Rfl: ;   atorvastatin (LIPITOR) 10 MG tablet, Take 10 mg by mouth daily., Disp: , Rfl: ;   clonazePAM (KLONOPIN) 0.5 MG tablet, Take 0.5 mg by mouth 3 (three) times daily as  needed for anxiety., Disp: , Rfl: ;   ibuprofen (ADVIL,MOTRIN) 200 MG tablet, Take 800 mg by mouth every 6 (six) hours as needed for mild pain., Disp: , Rfl:  metFORMIN (GLUCOPHAGE) 500 MG tablet, Take 1,000 mg by mouth 2 (two) times daily with a meal., Disp: , Rfl: ;  sertraline (ZOLOFT) 50 MG tablet, Take 50 mg by mouth every evening. , Disp: , Rfl:   No Known Allergies  History  Substance Use Topics  . Smoking status: Former Smoker    Quit date: 02/17/1996  . Smokeless tobacco: Not on file  . Alcohol Use: 0.0 oz/week     Comment: social    Family History  Problem Relation Age of Onset  . Hypertension Mother   . Hyperlipidemia Father   . Diabetes Father   . Stroke Father     2011     Review of Systems  Constitutional: Positive for malaise/fatigue and diaphoresis. Negative for fever, chills and weight loss.  HENT: Negative.   Eyes: Negative.   Respiratory: Negative.   Cardiovascular: Negative.   Gastrointestinal: Negative.   Genitourinary: Negative.   Musculoskeletal: Positive for joint pain. Negative for back pain, falls, myalgias and neck pain.       Right knee pain  Skin: Negative.   Neurological: Negative.  Negative for weakness.  Endo/Heme/Allergies: Negative.   Psychiatric/Behavioral: Negative.     Objective:  Physical Exam  Constitutional: She is oriented to person, place, and time. She appears well-developed and well-nourished. No distress.  HENT:  Head: Normocephalic  and atraumatic.  Right Ear: External ear normal.  Left Ear: External ear normal.  Nose: Nose normal.  Eyes: Conjunctivae and EOM are normal.  Neck: Normal range of motion. Neck supple. No tracheal deviation present. No thyromegaly present.  Cardiovascular: Normal rate, regular rhythm, normal heart sounds and intact distal pulses.   No murmur heard. Respiratory: Effort normal and breath sounds normal. No respiratory distress. She has no wheezes.  GI: Soft. Bowel sounds are normal. She  exhibits no distension. There is no tenderness.  Musculoskeletal:       Right hip: Normal.       Left hip: Normal.       Right knee: She exhibits swelling. She exhibits normal range of motion, no effusion and no erythema. Tenderness found. Medial joint line and lateral joint line tenderness noted.       Left knee: Normal.       Right lower leg: She exhibits no tenderness and no swelling.       Left lower leg: She exhibits no tenderness and no swelling.  Her right knee shows no effusion. Her range is 0 to 135. Moderate crepitus on range of motion with tenderness medial greater than lateral. She does have some AP laxity with a positive Lachman.  Neurological: She is alert and oriented to person, place, and time. She has normal strength and normal reflexes. No sensory deficit.  Skin: No rash noted. She is not diaphoretic. No erythema.  Psychiatric: She has a normal mood and affect. Her behavior is normal.    Vitals Weight: 230 lb Height: 67 in Body Surface Area: 2.22 m Body Mass Index: 36.02 kg/m Pulse: 72 (Regular) BP: 128/84 (Sitting, Left Arm, Standard)  Imaging Review Plain radiographs demonstrate severe degenerative joint disease of the right knee(s). The overall alignment ismild varus. The bone quality appears to be good for age and reported activity level.  Assessment/Plan:  End stage arthritis, right knee   The patient history, physical examination, clinical judgment of the provider and imaging studies are consistent with end stage degenerative joint disease of the right knee(s) and total knee arthroplasty is deemed medically necessary. The treatment options including medical management, injection therapy arthroscopy and arthroplasty were discussed at length. The risks and benefits of total knee arthroplasty were presented and reviewed. The risks due to aseptic loosening, infection, stiffness, patella tracking problems, thromboembolic complications and other  imponderables were discussed. The patient acknowledged the explanation, agreed to proceed with the plan and consent was signed. Patient is being admitted for inpatient treatment for surgery, pain control, PT, OT, prophylactic antibiotics, VTE prophylaxis, progressive ambulation and ADL's and discharge planning. The patient is planning to be discharged home with home health services      [place], [place]

## 2013-09-29 ENCOUNTER — Encounter (HOSPITAL_COMMUNITY)
Admission: RE | Disposition: A | Discharge status: Home Health | Payer: Self-pay | Source: Ambulatory Visit | Attending: Orthopedic Surgery

## 2013-09-29 ENCOUNTER — Encounter (HOSPITAL_COMMUNITY): Payer: 59

## 2013-09-29 ENCOUNTER — Encounter (HOSPITAL_COMMUNITY): Payer: Self-pay

## 2013-09-29 ENCOUNTER — Inpatient Hospital Stay (HOSPITAL_COMMUNITY): Payer: 59 | Admitting: Certified Registered Nurse Anesthetist

## 2013-09-29 ENCOUNTER — Inpatient Hospital Stay (HOSPITAL_COMMUNITY)
Admission: RE | Admit: 2013-09-29 | Discharge: 2013-10-01 | DRG: 470 | Disposition: A | Discharge status: Home Health | Payer: 59 | Source: Ambulatory Visit | Attending: Orthopedic Surgery | Admitting: Orthopedic Surgery

## 2013-09-29 DIAGNOSIS — F411 Generalized anxiety disorder: Secondary | ICD-10-CM | POA: Diagnosis present

## 2013-09-29 DIAGNOSIS — M1710 Unilateral primary osteoarthritis, unspecified knee: Principal | ICD-10-CM | POA: Diagnosis present

## 2013-09-29 DIAGNOSIS — D62 Acute posthemorrhagic anemia: Secondary | ICD-10-CM

## 2013-09-29 DIAGNOSIS — Z7982 Long term (current) use of aspirin: Secondary | ICD-10-CM

## 2013-09-29 DIAGNOSIS — Z833 Family history of diabetes mellitus: Secondary | ICD-10-CM

## 2013-09-29 DIAGNOSIS — Z79899 Other long term (current) drug therapy: Secondary | ICD-10-CM

## 2013-09-29 DIAGNOSIS — Z96651 Presence of right artificial knee joint: Secondary | ICD-10-CM

## 2013-09-29 DIAGNOSIS — E119 Type 2 diabetes mellitus without complications: Secondary | ICD-10-CM | POA: Diagnosis present

## 2013-09-29 DIAGNOSIS — K219 Gastro-esophageal reflux disease without esophagitis: Secondary | ICD-10-CM | POA: Diagnosis present

## 2013-09-29 DIAGNOSIS — M179 Osteoarthritis of knee, unspecified: Secondary | ICD-10-CM

## 2013-09-29 DIAGNOSIS — E785 Hyperlipidemia, unspecified: Secondary | ICD-10-CM | POA: Diagnosis present

## 2013-09-29 DIAGNOSIS — Z87891 Personal history of nicotine dependence: Secondary | ICD-10-CM

## 2013-09-29 DIAGNOSIS — E871 Hypo-osmolality and hyponatremia: Secondary | ICD-10-CM

## 2013-09-29 DIAGNOSIS — Z01812 Encounter for preprocedural laboratory examination: Secondary | ICD-10-CM

## 2013-09-29 HISTORY — PX: TOTAL KNEE ARTHROPLASTY: SHX125

## 2013-09-29 LAB — GLUCOSE, CAPILLARY
GLUCOSE-CAPILLARY: 128 mg/dL — AB (ref 70–99)
Glucose-Capillary: 125 mg/dL — ABNORMAL HIGH (ref 70–99)
Glucose-Capillary: 198 mg/dL — ABNORMAL HIGH (ref 70–99)

## 2013-09-29 LAB — TYPE AND SCREEN
ABO/RH(D): A POS
ANTIBODY SCREEN: NEGATIVE

## 2013-09-29 SURGERY — ARTHROPLASTY, KNEE, TOTAL
Anesthesia: Spinal | Site: Knee | Laterality: Right

## 2013-09-29 MED ORDER — PROMETHAZINE HCL 25 MG/ML IJ SOLN: 6.2500 mg | INTRAMUSCULAR | Status: DC | PRN

## 2013-09-29 MED ORDER — INSULIN ASPART 100 UNIT/ML ~~LOC~~ SOLN
0.0000 [IU] | Freq: Three times a day (TID) | SUBCUTANEOUS | Status: DC
  Administered 2013-09-30 (×2): 5 [IU] via SUBCUTANEOUS
  Administered 2013-09-30 – 2013-10-01 (×2): 3 [IU] via SUBCUTANEOUS

## 2013-09-29 MED ORDER — ATORVASTATIN CALCIUM 10 MG PO TABS
10.0000 mg | ORAL_TABLET | Freq: Every day | ORAL | Status: DC
  Administered 2013-09-30: 10 mg via ORAL
  Filled 2013-09-29 (×3): qty 1

## 2013-09-29 MED ORDER — MIDAZOLAM HCL 5 MG/5ML IJ SOLN
INTRAMUSCULAR | Status: DC | PRN
  Administered 2013-09-29 (×2): 1 mg via INTRAVENOUS

## 2013-09-29 MED ORDER — FLEET ENEMA 7-19 GM/118ML RE ENEM: 1.0000 | ENEMA | Freq: Once | RECTAL | Status: AC | PRN

## 2013-09-29 MED ORDER — ACETAMINOPHEN 325 MG PO TABS: 650.0000 mg | ORAL_TABLET | Freq: Four times a day (QID) | ORAL | Status: DC | PRN

## 2013-09-29 MED ORDER — POLYETHYLENE GLYCOL 3350 17 G PO PACK: 17.0000 g | PACK | Freq: Every day | ORAL | Status: DC | PRN

## 2013-09-29 MED ORDER — DEXAMETHASONE SODIUM PHOSPHATE 10 MG/ML IJ SOLN
INTRAMUSCULAR | Status: AC
  Filled 2013-09-29: qty 1

## 2013-09-29 MED ORDER — ONDANSETRON HCL 4 MG/2ML IJ SOLN
INTRAMUSCULAR | Status: AC
  Filled 2013-09-29: qty 2

## 2013-09-29 MED ORDER — SODIUM CHLORIDE 0.9 % IR SOLN
Status: DC | PRN
  Administered 2013-09-29: 1000 mL

## 2013-09-29 MED ORDER — SODIUM CHLORIDE 0.9 % IV SOLN
INTRAVENOUS | Status: DC
  Administered 2013-09-29: 18:00:00 via INTRAVENOUS

## 2013-09-29 MED ORDER — BUPIVACAINE LIPOSOME 1.3 % IJ SUSP
INTRAMUSCULAR | Status: DC | PRN
  Administered 2013-09-29: 20 mL

## 2013-09-29 MED ORDER — CEFAZOLIN SODIUM-DEXTROSE 2-3 GM-% IV SOLR
INTRAVENOUS | Status: AC
  Filled 2013-09-29: qty 50

## 2013-09-29 MED ORDER — SODIUM CHLORIDE 0.9 % IJ SOLN
INTRAMUSCULAR | Status: AC
  Filled 2013-09-29: qty 50

## 2013-09-29 MED ORDER — LACTATED RINGERS IV SOLN
INTRAVENOUS | Status: DC
Start: 2013-09-29 — End: 2013-09-29

## 2013-09-29 MED ORDER — PROPOFOL 10 MG/ML IV BOLUS
INTRAVENOUS | Status: AC
  Filled 2013-09-29: qty 20

## 2013-09-29 MED ORDER — ONDANSETRON HCL 4 MG PO TABS: 4.0000 mg | ORAL_TABLET | Freq: Four times a day (QID) | ORAL | Status: DC | PRN

## 2013-09-29 MED ORDER — FENTANYL CITRATE 0.05 MG/ML IJ SOLN
25.0000 ug | INTRAMUSCULAR | Status: DC | PRN
  Administered 2013-09-29 (×2): 50 ug via INTRAVENOUS

## 2013-09-29 MED ORDER — KETOROLAC TROMETHAMINE 15 MG/ML IJ SOLN
INTRAMUSCULAR | Status: AC
Start: 2013-09-29 — End: 2013-09-30
  Filled 2013-09-29: qty 1

## 2013-09-29 MED ORDER — BUPIVACAINE HCL (PF) 0.25 % IJ SOLN
INTRAMUSCULAR | Status: AC
  Filled 2013-09-29: qty 30

## 2013-09-29 MED ORDER — OXYCODONE HCL 5 MG PO TABS
5.0000 mg | ORAL_TABLET | ORAL | Status: DC | PRN
  Administered 2013-09-29 – 2013-10-01 (×9): 10 mg via ORAL
  Filled 2013-09-29 (×7): qty 2
  Filled 2013-09-29: qty 1
  Filled 2013-09-29 (×2): qty 2

## 2013-09-29 MED ORDER — RIVAROXABAN 10 MG PO TABS
10.0000 mg | ORAL_TABLET | Freq: Every day | ORAL | Status: DC
  Administered 2013-09-30 – 2013-10-01 (×2): 10 mg via ORAL
  Filled 2013-09-29 (×3): qty 1

## 2013-09-29 MED ORDER — FENTANYL CITRATE 0.05 MG/ML IJ SOLN
INTRAMUSCULAR | Status: AC
  Filled 2013-09-29: qty 2

## 2013-09-29 MED ORDER — MORPHINE SULFATE 2 MG/ML IJ SOLN
1.0000 mg | INTRAMUSCULAR | Status: DC | PRN
  Administered 2013-09-29 – 2013-09-30 (×3): 2 mg via INTRAVENOUS
  Filled 2013-09-29 (×3): qty 1

## 2013-09-29 MED ORDER — SODIUM CHLORIDE 0.9 % IV SOLN: INTRAVENOUS | Status: DC

## 2013-09-29 MED ORDER — BUPIVACAINE LIPOSOME 1.3 % IJ SUSP
20.0000 mL | Freq: Once | INTRAMUSCULAR | Status: DC
  Filled 2013-09-29: qty 20

## 2013-09-29 MED ORDER — 0.9 % SODIUM CHLORIDE (POUR BTL) OPTIME
TOPICAL | Status: DC | PRN
  Administered 2013-09-29: 1000 mL

## 2013-09-29 MED ORDER — CEFAZOLIN SODIUM-DEXTROSE 2-3 GM-% IV SOLR
2.0000 g | Freq: Four times a day (QID) | INTRAVENOUS | Status: AC
  Administered 2013-09-29 – 2013-09-30 (×2): 2 g via INTRAVENOUS
  Filled 2013-09-29 (×2): qty 50

## 2013-09-29 MED ORDER — TRANEXAMIC ACID 100 MG/ML IV SOLN
1000.0000 mg | INTRAVENOUS | Status: AC
  Administered 2013-09-29: 1000 mg via INTRAVENOUS
  Filled 2013-09-29: qty 10

## 2013-09-29 MED ORDER — MIDAZOLAM HCL 2 MG/2ML IJ SOLN
INTRAMUSCULAR | Status: AC
  Filled 2013-09-29: qty 2

## 2013-09-29 MED ORDER — METOCLOPRAMIDE HCL 5 MG/ML IJ SOLN
5.0000 mg | Freq: Three times a day (TID) | INTRAMUSCULAR | Status: DC | PRN
  Administered 2013-09-29: 23:00:00 10 mg via INTRAVENOUS
  Filled 2013-09-29: qty 2

## 2013-09-29 MED ORDER — PHENOL 1.4 % MT LIQD
1.0000 | OROMUCOSAL | Status: DC | PRN
  Filled 2013-09-29: qty 177

## 2013-09-29 MED ORDER — CEFAZOLIN SODIUM-DEXTROSE 2-3 GM-% IV SOLR
2.0000 g | INTRAVENOUS | Status: AC
  Administered 2013-09-29: 2 g via INTRAVENOUS

## 2013-09-29 MED ORDER — PROPOFOL INFUSION 10 MG/ML OPTIME
INTRAVENOUS | Status: DC | PRN
  Administered 2013-09-29: 50 ug/kg/min via INTRAVENOUS

## 2013-09-29 MED ORDER — DEXAMETHASONE SODIUM PHOSPHATE 10 MG/ML IJ SOLN
10.0000 mg | Freq: Every day | INTRAMUSCULAR | Status: AC
  Filled 2013-09-29: qty 1

## 2013-09-29 MED ORDER — METFORMIN HCL 500 MG PO TABS
1000.0000 mg | ORAL_TABLET | Freq: Two times a day (BID) | ORAL | Status: DC
  Filled 2013-09-29 (×3): qty 2

## 2013-09-29 MED ORDER — BUPIVACAINE HCL 0.25 % IJ SOLN
INTRAMUSCULAR | Status: DC | PRN
  Administered 2013-09-29: 20 mL

## 2013-09-29 MED ORDER — ACETAMINOPHEN 650 MG RE SUPP: 650.0000 mg | Freq: Four times a day (QID) | RECTAL | Status: DC | PRN

## 2013-09-29 MED ORDER — LACTATED RINGERS IV SOLN
INTRAVENOUS | Status: DC | PRN
  Administered 2013-09-29: 13:00:00 via INTRAVENOUS

## 2013-09-29 MED ORDER — DOCUSATE SODIUM 100 MG PO CAPS
100.0000 mg | ORAL_CAPSULE | Freq: Two times a day (BID) | ORAL | Status: DC
  Administered 2013-09-30 – 2013-10-01 (×3): 100 mg via ORAL

## 2013-09-29 MED ORDER — METHOCARBAMOL 100 MG/ML IJ SOLN
500.0000 mg | Freq: Four times a day (QID) | INTRAMUSCULAR | Status: DC | PRN
  Administered 2013-09-29: 19:00:00 500 mg via INTRAVENOUS
  Filled 2013-09-29 (×2): qty 5

## 2013-09-29 MED ORDER — TRAMADOL HCL 50 MG PO TABS: 50.0000 mg | ORAL_TABLET | Freq: Four times a day (QID) | ORAL | Status: DC | PRN

## 2013-09-29 MED ORDER — CLONAZEPAM 0.5 MG PO TABS
0.5000 mg | ORAL_TABLET | Freq: Three times a day (TID) | ORAL | Status: DC | PRN
  Administered 2013-09-30 – 2013-10-01 (×2): 0.5 mg via ORAL
  Filled 2013-09-29 (×2): qty 1

## 2013-09-29 MED ORDER — DIPHENHYDRAMINE HCL 12.5 MG/5ML PO ELIX: 12.5000 mg | ORAL_SOLUTION | ORAL | Status: DC | PRN

## 2013-09-29 MED ORDER — ACETAMINOPHEN 500 MG PO TABS
1000.0000 mg | ORAL_TABLET | Freq: Four times a day (QID) | ORAL | Status: AC
  Administered 2013-09-29 – 2013-09-30 (×3): 1000 mg via ORAL
  Filled 2013-09-29 (×8): qty 2

## 2013-09-29 MED ORDER — ONDANSETRON HCL 4 MG/2ML IJ SOLN
4.0000 mg | Freq: Four times a day (QID) | INTRAMUSCULAR | Status: DC | PRN
  Administered 2013-09-30: 4 mg via INTRAVENOUS
  Filled 2013-09-29: qty 2

## 2013-09-29 MED ORDER — FENTANYL CITRATE 0.05 MG/ML IJ SOLN
INTRAMUSCULAR | Status: DC | PRN
  Administered 2013-09-29 (×2): 50 ug via INTRAVENOUS

## 2013-09-29 MED ORDER — BISACODYL 10 MG RE SUPP: 10.0000 mg | Freq: Every day | RECTAL | Status: DC | PRN

## 2013-09-29 MED ORDER — MEPERIDINE HCL 50 MG/ML IJ SOLN: 6.2500 mg | INTRAMUSCULAR | Status: DC | PRN

## 2013-09-29 MED ORDER — EPHEDRINE SULFATE 50 MG/ML IJ SOLN
INTRAMUSCULAR | Status: AC
  Filled 2013-09-29: qty 1

## 2013-09-29 MED ORDER — ACETAMINOPHEN 500 MG PO TABS
1000.0000 mg | ORAL_TABLET | Freq: Once | ORAL | Status: AC
  Administered 2013-09-29: 1000 mg via ORAL
  Filled 2013-09-29: qty 2

## 2013-09-29 MED ORDER — METOCLOPRAMIDE HCL 10 MG PO TABS: 5.0000 mg | ORAL_TABLET | Freq: Three times a day (TID) | ORAL | Status: DC | PRN

## 2013-09-29 MED ORDER — MENTHOL 3 MG MT LOZG
1.0000 | LOZENGE | OROMUCOSAL | Status: DC | PRN
  Filled 2013-09-29: qty 9

## 2013-09-29 MED ORDER — DEXAMETHASONE SODIUM PHOSPHATE 10 MG/ML IJ SOLN
10.0000 mg | Freq: Once | INTRAMUSCULAR | Status: AC
  Administered 2013-09-29: 10 mg via INTRAVENOUS

## 2013-09-29 MED ORDER — DEXAMETHASONE 6 MG PO TABS
10.0000 mg | ORAL_TABLET | Freq: Every day | ORAL | Status: AC
  Administered 2013-09-30: 10:00:00 10 mg via ORAL
  Filled 2013-09-29: qty 1

## 2013-09-29 MED ORDER — ONDANSETRON HCL 4 MG/2ML IJ SOLN
INTRAMUSCULAR | Status: DC | PRN
  Administered 2013-09-29: 4 mg via INTRAVENOUS

## 2013-09-29 MED ORDER — SERTRALINE HCL 50 MG PO TABS
50.0000 mg | ORAL_TABLET | Freq: Every evening | ORAL | Status: DC
  Administered 2013-09-30: 17:00:00 50 mg via ORAL
  Filled 2013-09-29 (×3): qty 1

## 2013-09-29 MED ORDER — STERILE WATER FOR IRRIGATION IR SOLN
Status: DC | PRN
  Administered 2013-09-29: 1500 mL

## 2013-09-29 MED ORDER — SODIUM CHLORIDE 0.9 % IJ SOLN
INTRAMUSCULAR | Status: AC
  Filled 2013-09-29: qty 10

## 2013-09-29 MED ORDER — CHLORHEXIDINE GLUCONATE 4 % EX LIQD: 60.0000 mL | Freq: Once | CUTANEOUS | Status: DC

## 2013-09-29 MED ORDER — KETOROLAC TROMETHAMINE 15 MG/ML IJ SOLN
7.5000 mg | Freq: Four times a day (QID) | INTRAMUSCULAR | Status: AC | PRN
  Administered 2013-09-29: 7.5 mg via INTRAVENOUS

## 2013-09-29 MED ORDER — METHOCARBAMOL 500 MG PO TABS
500.0000 mg | ORAL_TABLET | Freq: Four times a day (QID) | ORAL | Status: DC | PRN
  Administered 2013-09-30 – 2013-10-01 (×3): 500 mg via ORAL
  Filled 2013-09-29 (×3): qty 1

## 2013-09-29 MED ORDER — SODIUM CHLORIDE 0.9 % IJ SOLN
INTRAMUSCULAR | Status: DC | PRN
  Administered 2013-09-29: 30 mL

## 2013-09-29 SURGICAL SUPPLY — 54 items
BAG ZIPLOCK 12X15 (MISCELLANEOUS) ×3 IMPLANT
BANDAGE ELASTIC 6 VELCRO ST LF (GAUZE/BANDAGES/DRESSINGS) ×3 IMPLANT
BANDAGE ESMARK 6X9 LF (GAUZE/BANDAGES/DRESSINGS) ×1 IMPLANT
BLADE SAG 18X100X1.27 (BLADE) ×3 IMPLANT
BLADE SAW SGTL 11.0X1.19X90.0M (BLADE) ×3 IMPLANT
BNDG ESMARK 6X9 LF (GAUZE/BANDAGES/DRESSINGS) ×3
BOWL SMART MIX CTS (DISPOSABLE) ×3 IMPLANT
CAP KNEE ATTUNE RP ×3 IMPLANT
CEMENT HV SMART SET (Cement) ×6 IMPLANT
CLOSURE WOUND 1/2 X4 (GAUZE/BANDAGES/DRESSINGS) ×2
CUFF TOURN SGL QUICK 34 (TOURNIQUET CUFF) ×2
CUFF TRNQT CYL 34X4X40X1 (TOURNIQUET CUFF) ×1 IMPLANT
DECANTER SPIKE VIAL GLASS SM (MISCELLANEOUS) ×3 IMPLANT
DRAPE EXTREMITY T 121X128X90 (DRAPE) ×3 IMPLANT
DRAPE POUCH INSTRU U-SHP 10X18 (DRAPES) ×3 IMPLANT
DRAPE U-SHAPE 47X51 STRL (DRAPES) ×3 IMPLANT
DRSG ADAPTIC 3X8 NADH LF (GAUZE/BANDAGES/DRESSINGS) ×3 IMPLANT
DURAPREP 26ML APPLICATOR (WOUND CARE) ×3 IMPLANT
ELECT REM PT RETURN 9FT ADLT (ELECTROSURGICAL) ×3
ELECTRODE REM PT RTRN 9FT ADLT (ELECTROSURGICAL) ×1 IMPLANT
EVACUATOR 1/8 PVC DRAIN (DRAIN) ×3 IMPLANT
FACESHIELD LNG OPTICON STERILE (SAFETY) ×15 IMPLANT
GLOVE BIO SURGEON STRL SZ7.5 (GLOVE) IMPLANT
GLOVE BIO SURGEON STRL SZ8 (GLOVE) ×3 IMPLANT
GLOVE BIOGEL PI IND STRL 8 (GLOVE) ×2 IMPLANT
GLOVE BIOGEL PI INDICATOR 8 (GLOVE) ×4
GLOVE SURG SS PI 6.5 STRL IVOR (GLOVE) IMPLANT
GOWN STRL REUS W/TWL LRG LVL3 (GOWN DISPOSABLE) ×3 IMPLANT
GOWN STRL REUS W/TWL XL LVL3 (GOWN DISPOSABLE) IMPLANT
HANDPIECE INTERPULSE COAX TIP (DISPOSABLE) ×2
IMMOBILIZER KNEE 20 (SOFTGOODS) ×3 IMPLANT
KIT BASIN OR (CUSTOM PROCEDURE TRAY) ×3 IMPLANT
MANIFOLD NEPTUNE II (INSTRUMENTS) ×3 IMPLANT
NDL SAFETY ECLIPSE 18X1.5 (NEEDLE) ×2 IMPLANT
NEEDLE HYPO 18GX1.5 SHARP (NEEDLE) ×4
NS IRRIG 1000ML POUR BTL (IV SOLUTION) ×3 IMPLANT
PACK TOTAL JOINT (CUSTOM PROCEDURE TRAY) ×3 IMPLANT
PAD ABD 8X10 STRL (GAUZE/BANDAGES/DRESSINGS) ×3 IMPLANT
PADDING CAST COTTON 6X4 STRL (CAST SUPPLIES) ×6 IMPLANT
POSITIONER SURGICAL ARM (MISCELLANEOUS) ×3 IMPLANT
SET HNDPC FAN SPRY TIP SCT (DISPOSABLE) ×1 IMPLANT
SPONGE GAUZE 4X4 12PLY (GAUZE/BANDAGES/DRESSINGS) ×3 IMPLANT
STRIP CLOSURE SKIN 1/2X4 (GAUZE/BANDAGES/DRESSINGS) ×4 IMPLANT
SUCTION FRAZIER 12FR DISP (SUCTIONS) ×3 IMPLANT
SUT MNCRL AB 4-0 PS2 18 (SUTURE) ×3 IMPLANT
SUT VIC AB 2-0 CT1 27 (SUTURE) ×6
SUT VIC AB 2-0 CT1 TAPERPNT 27 (SUTURE) ×3 IMPLANT
SUT VLOC 180 0 24IN GS25 (SUTURE) ×3 IMPLANT
SYR 20CC LL (SYRINGE) ×3 IMPLANT
SYR 50ML LL SCALE MARK (SYRINGE) ×3 IMPLANT
TOWEL OR 17X26 10 PK STRL BLUE (TOWEL DISPOSABLE) ×6 IMPLANT
TRAY FOLEY CATH 14FRSI W/METER (CATHETERS) ×3 IMPLANT
WATER STERILE IRR 1500ML POUR (IV SOLUTION) ×3 IMPLANT
WRAP KNEE MAXI GEL POST OP (GAUZE/BANDAGES/DRESSINGS) ×3 IMPLANT

## 2013-09-29 NOTE — Op Note (Signed)
Pre-operative diagnosis- Osteoarthritis  Right knee(s)  Post-operative diagnosis- Osteoarthritis Right knee(s)  Procedure-  Right  Total Knee Arthroplasty (Attune System)  Surgeon- Dione Plover. Michoel Kunin, MD  Assistant- Arlee Muslim, PA-C   Anesthesia-  Spinal EBL-* No blood loss amount entered *  Drains Hemovac  Tourniquet time-  Total Tourniquet Time Documented: Thigh (Left) - 40 minutes Total: Thigh (Left) - 40 minutes    Complications- None  Condition-PACU - hemodynamically stable.   Brief Clinical Note  Terri [name] is a 43 y.o. year old female with end stage OA of her right knee with progressively worsening pain and dysfunction. She has constant pain, with activity and at rest and significant functional deficits with difficulties even with ADLs. She has had extensive non-op management including analgesics, injections of cortisone and viscosupplements, and home exercise program, but remains in significant pain with significant dysfunction.Radiographs show bone on bone arthritis medial compartment. She has an ACL deficient knee and is thus not a candidate for unicompartmental arthroplasty.. She presents now for right Total Knee Arthroplasty.    Procedure in detail---   The patient is brought into the operating room and positioned supine on the operating table. After successful administration of  Spinal,   a tourniquet is placed high on the  Right thigh(s) and the lower extremity is prepped and draped in the usual sterile fashion. Time out is performed by the operating team and then the  Right lower extremity is wrapped in Esmarch, knee flexed and the tourniquet inflated to 300 mmHg.       A midline incision is made with a ten blade through the subcutaneous tissue to the level of the extensor mechanism. A fresh blade is used to make a medial parapatellar arthrotomy. Soft tissue over the proximal medial tibia is subperiosteally elevated to the joint line with a knife and into the  semimembranosus bursa with a Cobb elevator. Soft tissue over the proximal lateral tibia is elevated with attention being paid to avoiding the patellar tendon on the tibial tubercle. The patella is everted, knee flexed 90 degrees and the ACL and PCL are removed. Findings are bone on bone medial compartment with global osteophytes.        The drill is used to create a starting hole in the distal femur and the canal is thoroughly irrigated with sterile saline to remove the fatty contents. The 5 degree Right  valgus alignment guide is placed into the femoral canal and the distal femoral cutting block is pinned to remove 9 mm off the distal femur. Resection is made with an oscillating saw.      The tibia is subluxed forward and the menisci are removed. The extramedullary alignment guide is placed referencing proximally at the medial aspect of the tibial tubercle and distally along the second metatarsal axis and tibial crest. The block is pinned to remove 66mm off the more deficient medial  side. Resection is made with an oscillating saw. Size 5is the most appropriate size for the tibia and the proximal tibia is prepared with the modular drill and keel punch for that size.      The femoral sizing guide is placed and size 6 is most appropriate. Rotation is marked off the epicondylar axis and confirmed by creating a rectangular flexion gap at 90 degrees. The size 6 cutting block is pinned in this rotation and the anterior, posterior and chamfer cuts are made with the oscillating saw. The intercondylar block is then placed and that cut is made.  Trial size 5 tibial component, trial size 6 posterior stabilized femur and a 6  mm posterior stabilized rotating platform insert trial is placed. Full extension is achieved with excellent varus/valgus and anterior/posterior balance throughout full range of motion. The patella is everted and thickness measured to be 22  mm. Free hand resection is taken to 12 mm, a 32 template  is placed, lug holes are drilled, trial patella is placed, and it tracks normally. Osteophytes are removed off the posterior femur with the trial in place. All trials are removed and the cut bone surfaces prepared with pulsatile lavage. Cement is mixed and once ready for implantation, the size 5 tibial implant, size  6 narrow posterior stabilized femoral component, and the size 32 patella are cemented in place and the patella is held with the clamp. The trial insert is placed and the knee held in full extension. The Exparel (20 ml mixed with 30 ml saline) and .25% Bupivicaine, are injected into the extensor mechanism, posterior capsule, medial and lateral gutters and subcutaneous tissues.  All extruded cement is removed and once the cement is hard the permanent 6 mm posterior stabilized rotating platform insert is placed into the tibial tray.      The wound is copiously irrigated with saline solution and the extensor mechanism closed over a hemovac drain with #1 PDS suture. The tourniquet is released for a total tourniquet time of 40  minutes. Flexion against gravity is 140 degrees and the patella tracks normally. Subcutaneous tissue is closed with 2.0 vicryl and subcuticular with running 4.0 Monocryl. The incision is cleaned and dried and steri-strips and a bulky sterile dressing are applied. The limb is placed into a knee immobilizer and the patient is awakened and transported to recovery in stable condition.      Please note that a surgical assistant was a medical necessity for this procedure in order to perform it in a safe and expeditious manner. Surgical assistant was necessary to retract the ligaments and vital neurovascular structures to prevent injury to them and also necessary for proper positioning of the limb to allow for anatomic placement of the prosthesis.   Dione Plover Augustus Zurawski, MD    09/29/2013, 3:14 PM

## 2013-09-29 NOTE — Plan of Care (Signed)
Problem: Phase I Progression Outcomes Goal: Dangle or out of bed evening of surgery Outcome: Not Progressing Pt nauseated and vomiting x2. N/V increased with movement, no dangle.   Problem: Phase II Progression Outcomes Goal: Tolerating diet Outcome: Not Progressing Pt c/o N/V. Pt tolerating ice chips, slowly starting sips of clears.

## 2013-09-29 NOTE — Interval H&P Note (Signed)
History and Physical Interval Note:  03/13/9484 46:27 PM  Terri [name]  has presented today for surgery, with the diagnosis of OA OF RIGHT KNEE  The various methods of treatment have been discussed with the patient and family. After consideration of risks, benefits and other options for treatment, the patient has consented to  Procedure(s): RIGHT TOTAL KNEE ARTHROPLASTY (Right) as a surgical intervention .  The patient's history has been reviewed, patient examined, no change in status, stable for surgery.  I have reviewed the patient's chart and labs.  Questions were answered to the patient's satisfaction.     Gearlean Alf

## 2013-09-29 NOTE — Anesthesia Postprocedure Evaluation (Signed)
  Anesthesia Post-op Note  Patient: Terri [name]  Procedure(s) Performed: Procedure(s) (LRB): RIGHT TOTAL KNEE ARTHROPLASTY (Right)  Patient Location: PACU  Anesthesia Type: Spinal  Level of Consciousness: awake and alert   Airway and Oxygen Therapy: Patient Spontanous Breathing  Post-op Pain: mild  Post-op Assessment: Post-op Vital signs reviewed, Patient's Cardiovascular Status Stable, Respiratory Function Stable, Patent Airway and No signs of Nausea or vomiting  Last Vitals:  Filed Vitals:   09/29/13 1515  BP: 123/67  Pulse: 77  Temp: 36.9 C  Resp: 17    Post-op Vital Signs: stable   Complications: No apparent anesthesia complications

## 2013-09-29 NOTE — Anesthesia Preprocedure Evaluation (Addendum)
Anesthesia Evaluation  Patient identified by MRN, date of birth, ID band Patient awake    Reviewed: Allergy & Precautions, H&P , NPO status , Patient's Chart, lab work & pertinent test results  Airway Mallampati: II TM Distance: >3 FB Neck ROM: Full    Dental no notable dental hx.    Pulmonary neg pulmonary ROS, former smoker,  breath sounds clear to auscultation  Pulmonary exam normal       Cardiovascular negative cardio ROS  Rhythm:Regular Rate:Normal     Neuro/Psych negative neurological ROS  negative psych ROS   GI/Hepatic negative GI ROS, Neg liver ROS,   Endo/Other  negative endocrine ROS  Renal/GU negative Renal ROS  negative genitourinary   Musculoskeletal negative musculoskeletal ROS (+)   Abdominal   Peds negative pediatric ROS (+)  Hematology negative hematology ROS (+)   Anesthesia Other Findings   Reproductive/Obstetrics negative OB ROS                          Anesthesia Physical Anesthesia Plan  ASA: II  Anesthesia Plan: Spinal   Post-op Pain Management:    Induction:   Airway Management Planned:   Additional Equipment:   Intra-op Plan:   Post-operative Plan:   Informed Consent: I have reviewed the patients History and Physical, chart, labs and discussed the procedure including the risks, benefits and alternatives for the proposed anesthesia with the patient or authorized representative who has indicated his/her understanding and acceptance.   Dental advisory given  Plan Discussed with: CRNA  Anesthesia Plan Comments:        Anesthesia Quick Evaluation

## 2013-09-29 NOTE — Transfer of Care (Signed)
Immediate Anesthesia Transfer of Care Note  Patient: Terri [name]  Procedure(s) Performed: Procedure(s): RIGHT TOTAL KNEE ARTHROPLASTY (Right)  Patient Location: PACU  Anesthesia Type:MAC and Spinal  Level of Consciousness: awake, alert , oriented and patient cooperative  Airway & Oxygen Therapy: Patient Spontanous Breathing and Patient connected to face mask oxygen  Post-op Assessment: Report given to PACU RN and Post -op Vital signs reviewed and stable  Post vital signs: Reviewed and stable  Complications: No apparent anesthesia complications

## 2013-09-29 NOTE — Preoperative (Signed)
Beta Blockers   Reason not to administer Beta Blockers:Not Applicable 

## 2013-09-30 LAB — GLUCOSE, CAPILLARY
GLUCOSE-CAPILLARY: 229 mg/dL — AB (ref 70–99)
Glucose-Capillary: 187 mg/dL — ABNORMAL HIGH (ref 70–99)
Glucose-Capillary: 209 mg/dL — ABNORMAL HIGH (ref 70–99)
Glucose-Capillary: 223 mg/dL — ABNORMAL HIGH (ref 70–99)

## 2013-09-30 LAB — BASIC METABOLIC PANEL
BUN: 14 mg/dL (ref 6–23)
CO2: 26 mEq/L (ref 19–32)
CREATININE: 0.41 mg/dL — AB (ref 0.50–1.10)
Calcium: 8.6 mg/dL (ref 8.4–10.5)
Chloride: 101 mEq/L (ref 96–112)
GFR calc Af Amer: >90 mL/min (ref >90)
GFR calc non Af Amer: >90 mL/min (ref >90)
Glucose, Bld: 197 mg/dL — ABNORMAL HIGH (ref 70–99)
Potassium: 4.3 mEq/L (ref 3.7–5.3)
Sodium: 139 mEq/L (ref 137–147)

## 2013-09-30 LAB — CBC
HEMATOCRIT: 34.4 % — AB (ref 36.0–46.0)
Hemoglobin: 11.5 g/dL — ABNORMAL LOW (ref 12.0–15.0)
MCH: 27.8 pg (ref 26.0–34.0)
MCHC: 33.4 g/dL (ref 30.0–36.0)
MCV: 83.3 fL (ref 78.0–100.0)
Platelets: 177 10*3/uL (ref 150–400)
RBC: 4.13 MIL/uL (ref 3.87–5.11)
RDW: 13.3 % (ref 11.5–15.5)
WBC: 11.1 10*3/uL — ABNORMAL HIGH (ref 4.0–10.5)

## 2013-09-30 NOTE — Progress Notes (Signed)
09/30/13 1300  PT Visit Information  Last PT Received On 09/30/13  Assistance Needed +1  History of Present Illness s/p RTKA  PT Time Calculation  PT Start Time 1236  PT Stop Time 1250  PT Time Calculation (min) 14 min  Subjective Data  Patient Stated Goal I   Precautions  Precautions Knee  Required Braces or Orthoses Knee Immobilizer - Right  Knee Immobilizer - Right Discontinue once straight leg raise with < 10 degree lag  Cognition  Arousal/Alertness Awake/alert  Behavior During Therapy WFL for tasks assessed/performed  Overall Cognitive Status Within Functional Limits for tasks assessed  Transfers  Overall transfer level Needs assistance  Equipment used Rolling walker (2 wheeled)  Transfers Sit to/from Stand  Sit to Stand Supervision  General transfer comment cues for hand placement and RLE position  Ambulation/Gait  Ambulation/Gait assistance Supervision  Ambulation Distance (Feet) 150 Feet  Assistive device Rolling walker (2 wheeled)  Gait Pattern/deviations Step-to pattern;Step-through pattern  General Gait Details cues for sequence  Total Joint Exercises  Ankle Circles/Pumps AROM;10 reps  Straight Leg Raises AAROM;Right;10 reps;AROM  Hip ABduction/ADduction AROM;Right;10 reps  PT - End of Session  Equipment Utilized During Treatment Gait belt  Activity Tolerance Patient tolerated treatment well  Patient left in chair;with call bell/phone within reach  Nurse Communication Mobility status  PT - Assessment/Plan  PT Plan Current plan remains appropriate  PT Frequency Min 3X/week  Follow Up Recommendations Home health PT  PT equipment None recommended by PT  PT Goal Progression  Progress towards PT goals Progressing toward goals  Acute Rehab PT Goals  PT Goal Formulation With patient  Time For Goal Achievement 10/03/13  Potential to Achieve Goals Good  PT General Charges  $$ ACUTE PT VISIT 1 Procedure  PT Treatments  $Gait Training 23-37 mins

## 2013-09-30 NOTE — Discharge Instructions (Addendum)
Information on my medicine - XARELTO (Rivaroxaban)  This medication education was reviewed with me or my healthcare representative as part of my discharge preparation.  The pharmacist that spoke with me during my hospital stay was:  Emiliano Dyer, RPH  Why was Xarelto prescribed for you? Xarelto was prescribed for you to reduce the risk of a blood clots forming after orthopedic surgery OR to reduce the risk of forming blood clots that cause a stroke if you have a medical condition called atrial fibrillation (a type of irregular heartbeat).  What do you need to know about xarelto ? Take your Xarelto ONCE DAILY at the same time every day with your evening meal. If you have difficulty swallowing the tablet whole, you may crush it and mix in applesauce just prior to taking your dose.  Take Xarelto exactly as prescribed by your doctor and DO NOT stop taking Xarelto without talking to the doctor who prescribed the medication.  Stopping without other stroke or VTE prevention medication to take the place of Xarelto may increase your risk of developing a new clot or stroke.  Refill your prescription before you run out.  After discharge, you should have regular check-up appointments with your healthcare provider that is prescribing your Xarelto.  In the future your dose may need to be changed if your kidney function or weight changes by a significant amount.  What do you do if you miss a dose? If you are taking Xarelto ONCE DAILY and you miss a dose, take it as soon as you remember on the same day then continue your regularly scheduled once daily regimen the next day. Do not take two doses of Xarelto at the same time.   Important Safety Information A possible side effect of Xarelto is bleeding. You should call your healthcare provider right away if you experience any of the following:   Bleeding from an injury or your nose that does not stop.   Unusual colored urine (red or dark brown) or  unusual colored stools (red or black).   Unusual bruising for unknown reasons.   A serious fall or if you hit your head (even if there is no bleeding).  Some medicines may interact with Xarelto and might increase your risk of bleeding while on Xarelto. To help avoid this, consult your healthcare provider or pharmacist prior to using any new prescription or non-prescription medications, including herbals, vitamins, non-steroidal anti-inflammatory drugs (NSAIDs) and supplements.  This website has more information on Xarelto: [email or web address]    Dr. Gaynelle Arabian Total Joint Specialist [facility] [address], [place], [place] [address] [phone]  TOTAL KNEE REPLACEMENT POSTOPERATIVE DIRECTIONS    Knee Rehabilitation, Guidelines Following Surgery  Results after knee surgery are often greatly improved when you follow the exercise, range of motion and muscle strengthening exercises prescribed by your doctor. Safety measures are also important to protect the knee from further injury. Any time any of these exercises cause you to have increased pain or swelling in your knee joint, decrease the amount until you are comfortable again and slowly increase them. If you have problems or questions, call your caregiver or physical therapist for advice.   HOME CARE INSTRUCTIONS  Remove items at home which could result in a fall. This includes throw rugs or furniture in walking pathways.  Continue medications as instructed at time of discharge. You may have some home medications which will be placed on hold until you complete the course of blood thinner medication.  You may start showering once you are discharged home but do not submerge the incision under water. Just pat the incision dry and apply a dry gauze dressing on daily. Walk with walker as instructed.  You may resume a sexual relationship in one month or when given the OK by  your doctor.   Use walker as long as  suggested by your caregivers.  Avoid periods of inactivity such as sitting longer than an hour when not asleep. This helps prevent blood clots.  You may put full weight on your legs and walk as much as is comfortable.  You may return to work once you are cleared by your doctor.  Do not drive a car for 6 weeks or until released by you surgeon.   Do not drive while taking narcotics.  Wear the elastic stockings for three weeks following surgery during the day but you may remove then at night. Make sure you keep all of your appointments after your operation with all of your doctors and caregivers. You should call the office at the above phone number and make an appointment for approximately two weeks after the date of your surgery. Change the dressing daily and reapply a dry dressing each time. Please pick up a stool softener and laxative for home use as long as you are requiring pain medications.  Continue to use ice on the knee for pain and swelling from surgery. You may notice swelling that will progress down to the foot and ankle.  This is normal after surgery.  Elevate the leg when you are not up walking on it.   It is important for you to complete the blood thinner medication as prescribed by your doctor.  Continue to use the breathing machine which will help keep your temperature down.  It is common for your temperature to cycle up and down following surgery, especially at night when you are not up moving around and exerting yourself.  The breathing machine keeps your lungs expanded and your temperature down.  RANGE OF MOTION AND STRENGTHENING EXERCISES  Rehabilitation of the knee is important following a knee injury or an operation. After just a few days of immobilization, the muscles of the thigh which control the knee become weakened and shrink (atrophy). Knee exercises are designed to build up the tone and strength of the thigh muscles and to improve knee motion. Often times heat used for  twenty to thirty minutes before working out will loosen up your tissues and help with improving the range of motion but do not use heat for the first two weeks following surgery. These exercises can be done on a training (exercise) mat, on the floor, on a table or on a bed. Use what ever works the best and is most comfortable for you Knee exercises include:  Leg Lifts - While your knee is still immobilized in a splint or cast, you can do straight leg raises. Lift the leg to 60 degrees, hold for 3 sec, and slowly lower the leg. Repeat 10-20 times 2-3 times daily. Perform this exercise against resistance later as your knee gets better.  Quad and Hamstring Sets - Tighten up the muscle on the front of the thigh (Quad) and hold for 5-10 sec. Repeat this 10-20 times hourly. Hamstring sets are done by pushing the foot backward against an object and holding for 5-10 sec. Repeat as with quad sets.  A rehabilitation program following serious knee injuries can speed recovery and prevent re-injury in the future  due to weakened muscles. Contact your doctor or a physical therapist for more information on knee rehabilitation.  ° °SKILLED REHAB INSTRUCTIONS: °If the patient is transferred to a skilled rehab facility following release from the hospital, a list of the current medications will be sent to the facility for the patient to continue.  When discharged from the skilled rehab facility, please have the facility set up the patient's Home Health Physical Therapy prior to being released. Also, the skilled facility will be responsible for providing the patient with their medications at time of release from the facility to include their pain medication, the muscle relaxants, and their blood thinner medication. If the patient is still at the rehab facility at time of the two week follow up appointment, the skilled rehab facility will also need to assist the patient in arranging follow up appointment in our office and any  transportation needs. ° °MAKE SURE YOU:  °Understand these instructions.  °Will watch your condition.  °Will get help right away if you are not doing well or get worse.  ° ° °Pick up stool softner and laxative for home. °Do not submerge incision under water. °May shower. °Continue to use ice for pain and swelling from surgery. ° °Take Xarelto for two and a half more weeks, then discontinue Xarelto. °Once the patient has completed the Xarelto, they may resume the 81 mg Aspirin. ° ° ° ° ° ° ° ° ° °

## 2013-09-30 NOTE — Progress Notes (Signed)
   Subjective: 1 Day Post-Op Procedure(s) (LRB): RIGHT TOTAL KNEE ARTHROPLASTY (Right) Patient reports pain as mild and moderate.   Patient seen in rounds with Dr. Wynelle Link.  Patient had a rough night with N/V but better this morning. Patient is well, but has had some minor complaints of pain in the knee, requiring pain medications We will start therapy today.  Plan is to go Home after hospital stay.  Objective: Vital signs in last 24 hours: Temp:  [97.5 F (36.4 C)-98.4 F (36.9 C)] 97.6 F (36.4 C) (01/13 0539) Pulse Rate:  [58-88] 71 (01/13 0539) Resp:  [14-17] 14 (01/13 0539) BP: (108-147)/(65-91) 123/75 mmHg (01/13 0539) SpO2:  [93 %-100 %] 97 % (01/13 0539) Weight:  [105.915 kg (233 lb 8 oz)] 105.915 kg (233 lb 8 oz) (01/12 2220)  Intake/Output from previous day:  Intake/Output Summary (Last 24 hours) at 09/30/13 0803 Last data filed at 09/30/13 0630  Gross per 24 hour  Intake 3266.25 ml  Output   1350 ml  Net 1916.25 ml    Intake/Output this shift: UOP 450 since MN +1916  Labs:  Recent Labs  09/30/13 0550  HGB 11.5*    Recent Labs  09/30/13 0550  WBC 11.1*  RBC 4.13  HCT 34.4*  PLT 177    Recent Labs  09/30/13 0550  NA 139  K 4.3  CL 101  CO2 26  BUN 14  CREATININE 0.41*  GLUCOSE 197*  CALCIUM 8.6   No results found for this basename: LABPT, INR,  in the last 72 hours  EXAM General - Patient is Alert and Appropriate Extremity - Neurovascular intact Sensation intact distally Dorsiflexion/Plantar flexion intact Dressing - dressing C/D/I Motor Function - intact, moving foot and toes well on exam.  Hemovac pulled without difficulty.  Past Medical History  Diagnosis Date  . Hyperlipidemia   . Anxiety   . Arthritis     knee pain  . Complication of anesthesia     SLIGHT NAUSEA  . Diabetes mellitus without complication   . Diastasis recti     Assessment/Plan: 1 Day Post-Op Procedure(s) (LRB): RIGHT TOTAL KNEE ARTHROPLASTY  (Right) Principal Problem:   OA (osteoarthritis) of knee  Estimated body mass index is 36.56 kg/(m^2) as calculated from the following:   Height as of this encounter: 5\' 7"  (1.702 m).   Weight as of this encounter: 105.915 kg (233 lb 8 oz). Advance diet Up with therapy Plan for discharge tomorrow Discharge home with home health  DVT Prophylaxis - Xarelto Weight-Bearing as tolerated to right leg D/C O2 and Pulse OX and try on Room Air  Mylinda Brook, [place] 09/30/2013, 8:03 AM

## 2013-09-30 NOTE — Care Management Note (Addendum)
    Page 1 of 1   10/01/2013     1:05:12 PM   CARE MANAGEMENT NOTE 10/01/2013  Patient:  Terri [name],Terri [name]   Account Number:  [id number]  Date Initiated:  09/30/2013  Documentation initiated by:  Sherrin Daisy  Subjective/Objective Assessment:   dx total rt knee replacemnt     Action/Plan:   CM spoke with patient. Plans are for her to return to her home in [place] where she will have spouse, Aunt, and neighbor as caregivers. She already has RW, shower chair and cane. Plans to borrow 3n1 if available. [facility].   Anticipated DC Date:  10/01/2013   Anticipated DC Plan:  [place]  CM consult      [facility] Choice  HOME HEALTH   Choice offered to / List presented to:  C-1 Patient        [place] arranged  HH-2 PT      [place]   Status of service:  Completed, signed off Medicare Important Message given?   (If response is "NO", the following Medicare IM given date fields will be blank) Date Medicare IM given:   Date Additional Medicare IM given:    Discharge Disposition:  [place]  Per UR Regulation:  Reviewed for med. necessity/level of care/duration of stay  If discussed at  of Stay Meetings, dates discussed:    Comments:  10/01/2013 Sherrin Daisy BSN RN CCM [phone] Pt discharged today with HHpt services in place which will start tomorrow. Pt states she does have access to 3n1.

## 2013-09-30 NOTE — Evaluation (Signed)
Occupational Therapy Evaluation [name] MRN: [id number] DOB: [birth year] Today's Date: 09/30/2013 Time: 2841-3244 OT Time Calculation (min): 24 min  OT Assessment / Plan / Recommendation History of present illness s/p RTKA   Clinical Impression   Pt was admitted for R TKA.  All education was completed. No further OT is needed at this time.     OT Assessment  Patient does not need any further OT services    Follow Up Recommendations  No OT follow up    Barriers to Discharge      Equipment Recommendations  None recommended by OT    Recommendations for Other Services    Frequency       Precautions / Restrictions Precautions Precautions: Knee Required Braces or Orthoses: Knee Immobilizer - Right Knee Immobilizer - Right: Discontinue once straight leg raise with < 10 degree lag Restrictions Weight Bearing Restrictions: No   Pertinent Vitals/Pain No pain.  Reports she feels bandage more than anything.  Repositioned with ice    ADL  Grooming: Set up;Wash/dry hands;Wash/dry face Where Assessed - Grooming: Unsupported sitting Upper Body Bathing: Set up Where Assessed - Upper Body Bathing: Unsupported sitting Lower Body Bathing: Minimal assistance Where Assessed - Lower Body Bathing: Supported sit to stand Upper Body Dressing: Minimal assistance (iv) Where Assessed - Upper Body Dressing: Unsupported sitting Lower Body Dressing: Minimal assistance (underwear) Where Assessed - Lower Body Dressing: Supported sit to stand Toilet Transfer: Magazine features editor Method: Sit to Loss adjuster, chartered: Raised toilet seat with arms (or 3-in-1 over toilet) Toileting - Clothing Manipulation and Hygiene: Min guard Where Assessed - Toileting Clothing Manipulation and Hygiene: Sit to stand from 3-in-1 or toilet Equipment Used: Rolling walker Transfers/Ambulation Related to ADLs: ambulated to bathroom with min guard.  cues only to side step through  tight space ADL Comments: Pt is able to reach to start pants.  Will have family's help.  She has used tub bench and feels comfortable with it    OT Diagnosis:    OT Problem List:   OT Treatment Interventions:     OT Goals(Current goals can be found in the care plan section) Acute Rehab OT Goals Patient Stated Goal: I   Visit Information  Last OT Received On: 09/30/13 Assistance Needed: +1 History of Present Illness: s/p RTKA       Prior Functioning     Home Living Family/patient expects to be discharged to:: Private residence Living Arrangements: Spouse/significant other Available Help at Discharge: Family Type of Home: House Home Access: Stairs to enter Technical brewer of Steps: [place]: Two level;Able to live on main level with bedroom/bathroom Home Equipment: Tub bench;Bedside commode Additional Comments: pt is borrowing 3:1.  Has used tub bench before Prior Function Level of Independence: Independent Communication Communication: No difficulties         Vision/Perception     Cognition  Cognition Arousal/Alertness: Awake/alert Behavior During Therapy: WFL for tasks assessed/performed Overall Cognitive Status: Within Functional Limits for tasks assessed    Extremity/Trunk Assessment Upper Extremity Assessment Upper Extremity Assessment: Overall WFL for tasks assessed     Mobility  Transfers Overall transfer level: Needs assistance Equipment used: Rolling walker (2 wheeled) Transfers: Sit to/from Stand Sit to Stand: Min guard General transfer comment: cues for hand and LE position     Exercise    Balance     End of Session OT - End of Session Activity Tolerance: Patient tolerated treatment well Patient left: in chair;with  call bell/phone within reach  [place] 09/30/2013, 12:32 PM Lesle Chris, OTR/L [phone] 09/30/2013

## 2013-09-30 NOTE — Progress Notes (Signed)
Hemovac d/c'd by MD this am at bedside. Pt denies any discomfort after PRN pain med. Sitting up in chair without difficulty, BSC this am x1 with assist. Was able to void 316ml slightly cloudy straw yellow urine.

## 2013-09-30 NOTE — Evaluation (Signed)
Physical Therapy Evaluation [name] MRN: [id number] DOB: 08/12/1971 Today's Date: 09/30/2013 Time: 0912-0933 PT Time Calculation (min): 21 min  PT Assessment / Plan / Recommendation History of Present Illness  s/p RTKA  Clinical Impression  Pt will benefit from PT to address  Deficits below  PT Assessment       Follow Up Recommendations  Home health PT    Does the patient have the potential to tolerate intense rehabilitation      Barriers to Discharge        Equipment Recommendations       Recommendations for Other Services     Frequency      Precautions / Restrictions Precautions Precautions: Knee Required Braces or Orthoses: Knee Immobilizer - Right Knee Immobilizer - Right: Discontinue once straight leg raise with < 10 degree lag   Pertinent Vitals/Pain VSS pain controlled      Mobility  Bed Mobility Overal bed mobility: Needs Assistance Bed Mobility: Supine to Sit Supine to sit: Supervision General bed mobility comments: cues for technique Transfers Overall transfer level: Needs assistance Equipment used: Rolling walker (2 wheeled) Transfers: Sit to/from Stand Sit to Stand: Min guard General transfer comment: cues for safe technique Ambulation/Gait Ambulation/Gait assistance: Supervision;Min guard Ambulation Distance (Feet): 100 Feet Assistive device: Rolling walker (2 wheeled) General Gait Details: cues for sequence    Exercises Total Joint Exercises Ankle Circles/Pumps: AROM;10 reps Quad Sets: AROM;10 reps;Both Heel Slides: AROM;10 reps;Right   PT Diagnosis:    PT Problem List:   PT Treatment Interventions:       PT Goals(Current goals can be found in the care plan section) Acute Rehab PT Goals Patient Stated Goal: I  PT Goal Formulation: With patient Time For Goal Achievement: 09/30/13 Potential to Achieve Goals: Good  Visit Information  Last PT Received On: 09/30/13 Assistance Needed: +1 History of Present  Illness: s/p RTKA       Prior Functioning  Home Living Family/patient expects to be discharged to:: Private residence Living Arrangements: Spouse/significant other Available Help at Discharge: Family (4 aunts planning to help) Type of Home: House Home Access: Stairs to enter [employer] of Steps: [place]: Two level;Able to live on main level with bedroom/bathroom Home Equipment: Gilford Rile - 2 wheels Prior Function Level of Independence: Independent Communication Communication: No difficulties    Cognition  Cognition Arousal/Alertness: Awake/alert Behavior During Therapy: WFL for tasks assessed/performed Overall Cognitive Status: Within Functional Limits for tasks assessed    Extremity/Trunk Assessment Upper Extremity Assessment Upper Extremity Assessment: Overall WFL for tasks assessed Lower Extremity Assessment Lower Extremity Assessment: RLE deficits/detail RLE Deficits / Details: able to do SLR; ankle WFL   Balance    End of Session PT - End of Session Equipment Utilized During Treatment: Gait belt Activity Tolerance: Patient tolerated treatment well Patient left: in chair;with call bell/phone within reach Nurse Communication: Mobility status  GP     [facility] 09/30/2013, 9:47 AM

## 2013-10-01 LAB — BASIC METABOLIC PANEL
BUN: 12 mg/dL (ref 6–23)
CO2: 25 meq/L (ref 19–32)
Calcium: 8.6 mg/dL (ref 8.4–10.5)
Chloride: 97 mEq/L (ref 96–112)
Creatinine, Ser: 0.46 mg/dL — ABNORMAL LOW (ref 0.50–1.10)
GFR calc Af Amer: >90 mL/min (ref >90)
GFR calc non Af Amer: >90 mL/min (ref >90)
Glucose, Bld: 206 mg/dL — ABNORMAL HIGH (ref 70–99)
POTASSIUM: 3.9 meq/L (ref 3.7–5.3)
SODIUM: 134 meq/L — AB (ref 137–147)

## 2013-10-01 LAB — GLUCOSE, CAPILLARY: GLUCOSE-CAPILLARY: 194 mg/dL — AB (ref 70–99)

## 2013-10-01 LAB — CBC
HEMATOCRIT: 34.5 % — AB (ref 36.0–46.0)
HEMOGLOBIN: 11.4 g/dL — AB (ref 12.0–15.0)
MCH: 27.9 pg (ref 26.0–34.0)
MCHC: 33 g/dL (ref 30.0–36.0)
MCV: 84.4 fL (ref 78.0–100.0)
Platelets: 179 10*3/uL (ref 150–400)
RBC: 4.09 MIL/uL (ref 3.87–5.11)
RDW: 13.6 % (ref 11.5–15.5)
WBC: 10.4 10*3/uL (ref 4.0–10.5)

## 2013-10-01 MED ORDER — OXYCODONE HCL 5 MG PO TABS: 5.0000 mg | ORAL_TABLET | ORAL | Status: DC | PRN

## 2013-10-01 MED ORDER — RIVAROXABAN 10 MG PO TABS: 10.0000 mg | ORAL_TABLET | Freq: Every day | ORAL | Status: DC

## 2013-10-01 MED ORDER — METHOCARBAMOL 500 MG PO TABS: 500.0000 mg | ORAL_TABLET | Freq: Four times a day (QID) | ORAL | Status: DC | PRN

## 2013-10-01 MED ORDER — TRAMADOL HCL 50 MG PO TABS: 50.0000 mg | ORAL_TABLET | Freq: Four times a day (QID) | ORAL | Status: DC | PRN

## 2013-10-01 MED ORDER — SODIUM CHLORIDE 0.9 % IJ SOLN: 3.0000 mL | Freq: Two times a day (BID) | INTRAMUSCULAR | Status: DC

## 2013-10-01 NOTE — Progress Notes (Signed)
Physical Therapy Treatment [name] MRN: [id number] DOB: 05/25/1971 Today's Date: 10/01/2013 Time: 3664-4034 PT Time Calculation (min): 43 min  PT Assessment / Plan / Recommendation  History of Present Illness s/p RTKA   PT Comments   *Pt notes increased pain in R knee today. 5/10 at rest, 7/10 with activity. Pt ambulated and performed TKA exercises with assist. Will do stair training later this morning once her husband arrives, then will be ready to DC home. **  Follow Up Recommendations  Home health PT     Does the patient have the potential to tolerate intense rehabilitation     Barriers to Discharge        Equipment Recommendations  None recommended by PT    Recommendations for Other Services    Frequency Min 3X/week   Progress towards PT Goals Progress towards PT goals: Progressing toward goals  Plan Current plan remains appropriate    Precautions / Restrictions Precautions Precautions: Knee Required Braces or Orthoses: Knee Immobilizer - Right Knee Immobilizer - Right: Discontinue once straight leg raise with < 10 degree lag   Pertinent Vitals/Pain **5/10 R knee at rest, 7/10 with activity Premedicated, ice applied*    Mobility  Transfers Overall transfer level: Needs assistance Equipment used: Rolling walker (2 wheeled) Transfers: Sit to/from Stand Sit to Stand: Supervision General transfer comment: cues for hand placement and RLE position Ambulation/Gait Ambulation/Gait assistance: Supervision Ambulation Distance (Feet): 140 Feet Assistive device: Rolling walker (2 wheeled) Gait Pattern/deviations: Step-to pattern General Gait Details: VCs to lift head    Exercises Total Joint Exercises Ankle Circles/Pumps: AROM;10 reps Quad Sets: AROM;10 reps;Both Short Arc Quad: AAROM;Right;10 reps;Supine Heel Slides: AROM;10 reps;Right;Supine Hip ABduction/ADduction: AAROM;Right;10 reps;Supine Straight Leg Raises: AAROM;Right;10  reps;Supine Goniometric ROM: 40* flexion R knee AAROM, ext 0*  Issued handout of exercises. Encouraged frequent ambulation at home, and performance of exercises TID.    PT Diagnosis:    PT Problem List:   PT Treatment Interventions:     PT Goals (current goals can now be found in the care plan section) Acute Rehab PT Goals Patient Stated Goal: to be able to walk PT Goal Formulation: With patient Time For Goal Achievement: 10/03/13 Potential to Achieve Goals: Good  Visit Information  Last PT Received On: 10/01/13 Assistance Needed: +1 History of Present Illness: s/p RTKA    Subjective Data  Patient Stated Goal: to be able to walk   Cognition  Cognition Arousal/Alertness: Awake/alert Behavior During Therapy: WFL for tasks assessed/performed Overall Cognitive Status: Within Functional Limits for tasks assessed    Balance     End of Session PT - End of Session Equipment Utilized During Treatment: Gait belt Activity Tolerance: Patient limited by pain Patient left: in chair;with call bell/phone within reach Nurse Communication: Mobility status   GP     Blondell Reveal [place] 10/01/2013, 9:57 AM [phone]

## 2013-10-01 NOTE — Discharge Summary (Signed)
Physician Discharge Summary   Patient ID: Terri [name] MRN: [id number] DOB/AGE: May 26, 1971 43 y.o.  Admit date: 09/29/2013 Discharge date: 10/01/2013  Primary Diagnosis:  Osteoarthritis Right knee(s)  Admission Diagnoses:  Past Medical History  Diagnosis Date  . Hyperlipidemia   . Anxiety   . Arthritis     knee pain  . Complication of anesthesia     SLIGHT NAUSEA  . Diabetes mellitus without complication   . Diastasis recti    Discharge Diagnoses:   Principal Problem:   OA (osteoarthritis) of knee Active Problems:   Hyponatremia   Postoperative anemia due to acute blood loss  Estimated body mass index is 36.56 kg/(m^2) as calculated from the following:   Height as of this encounter: _0  (1.702 m).   Weight as of this encounter: 105.915 kg (233 lb 8 oz).  Procedure:  Procedure(s) (LRB): RIGHT TOTAL KNEE ARTHROPLASTY (Right)   Consults: None  HPI: Terri [name] is a 43 y.o. year old female with end stage OA of her right knee with progressively worsening pain and dysfunction. She has constant pain, with activity and at rest and significant functional deficits with difficulties even with ADLs. She has had extensive non-op management including analgesics, injections of cortisone and viscosupplements, and home exercise program, but remains in significant pain with significant dysfunction.Radiographs show bone on bone arthritis medial compartment. She has an ACL deficient knee and is thus not a candidate for unicompartmental arthroplasty.. She presents now for right Total Knee Arthroplasty.   Laboratory Data: Admission on 09/29/2013, Discharged on 10/01/2013  Component Date Value Range Status  . Glucose-Capillary 09/29/2013 128* 70 - 99 mg/dL Final  . Glucose-Capillary 09/29/2013 125* 70 - 99 mg/dL Final  . Comment 1 09/29/2013 Documented in Chart   Final  . Comment 2 09/29/2013 Notify RN   Final  . WBC 09/30/2013 11.1* 4.0 - 10.5 K/uL Final  . RBC 09/30/2013 4.13  3.87 -  5.11 MIL/uL Final  . Hemoglobin 09/30/2013 11.5* 12.0 - 15.0 g/dL Final  . HCT 09/30/2013 34.4* 36.0 - 46.0 % Final  . MCV 09/30/2013 83.3  78.0 - 100.0 fL Final  . MCH 09/30/2013 27.8  26.0 - 34.0 pg Final  . MCHC 09/30/2013 33.4  30.0 - 36.0 g/dL Final  . RDW 09/30/2013 13.3  11.5 - 15.5 % Final  . Platelets 09/30/2013 177  150 - 400 K/uL Final  . Sodium 09/30/2013 139  137 - 147 mEq/L Final  . Potassium 09/30/2013 4.3  3.7 - 5.3 mEq/L Final  . Chloride 09/30/2013 101  96 - 112 mEq/L Final  . CO2 09/30/2013 26  19 - 32 mEq/L Final  . Glucose, Bld 09/30/2013 197* 70 - 99 mg/dL Final  . BUN 09/30/2013 14  6 - 23 mg/dL Final  . Creatinine, Ser 09/30/2013 0.41* 0.50 - 1.10 mg/dL Final  . Calcium 09/30/2013 8.6  8.4 - 10.5 mg/dL Final  . GFR calc non Af Amer 09/30/2013 >90  >90 mL/min Final  . GFR calc Af Amer 09/30/2013 >90  >90 mL/min Final   Comment: (NOTE)                          The eGFR has been calculated using the CKD EPI equation.                          This calculation has not been validated in all clinical situations.  eGFR's persistently <90 mL/min signify possible Chronic Kidney                          Disease.  . Glucose-Capillary 09/29/2013 198* 70 - 99 mg/dL Final  . Glucose-Capillary 09/30/2013 187* 70 - 99 mg/dL Final  . Glucose-Capillary 09/30/2013 209* 70 - 99 mg/dL Final  . Comment 1 09/30/2013 Notify RN   Final  . Glucose-Capillary 09/30/2013 223* 70 - 99 mg/dL Final  . WBC 10/01/2013 10.4  4.0 - 10.5 K/uL Final  . RBC 10/01/2013 4.09  3.87 - 5.11 MIL/uL Final  . Hemoglobin 10/01/2013 11.4* 12.0 - 15.0 g/dL Final  . HCT 10/01/2013 34.5* 36.0 - 46.0 % Final  . MCV 10/01/2013 84.4  78.0 - 100.0 fL Final  . MCH 10/01/2013 27.9  26.0 - 34.0 pg Final  . MCHC 10/01/2013 33.0  30.0 - 36.0 g/dL Final  . RDW 10/01/2013 13.6  11.5 - 15.5 % Final  . Platelets 10/01/2013 179  150 - 400 K/uL Final  . Sodium 10/01/2013 134* 137 - 147 mEq/L Final    . Potassium 10/01/2013 3.9  3.7 - 5.3 mEq/L Final  . Chloride 10/01/2013 97  96 - 112 mEq/L Final  . CO2 10/01/2013 25  19 - 32 mEq/L Final  . Glucose, Bld 10/01/2013 206* 70 - 99 mg/dL Final  . BUN 10/01/2013 12  6 - 23 mg/dL Final  . Creatinine, Ser 10/01/2013 0.46* 0.50 - 1.10 mg/dL Final  . Calcium 10/01/2013 8.6  8.4 - 10.5 mg/dL Final  . GFR calc non Af Amer 10/01/2013 >90  >90 mL/min Final  . GFR calc Af Amer 10/01/2013 >90  >90 mL/min Final   Comment: (NOTE)                          The eGFR has been calculated using the CKD EPI equation.                          This calculation has not been validated in all clinical situations.                          eGFR's persistently <90 mL/min signify possible Chronic Kidney                          Disease.  . Glucose-Capillary 09/30/2013 229* 70 - 99 mg/dL Final  . Glucose-Capillary 10/01/2013 194* 70 - 99 mg/dL Final  Hospital Outpatient Visit on 09/22/2013  Component Date Value Range Status  . aPTT 09/22/2013 29  24 - 37 seconds Final  . WBC 09/22/2013 6.4  4.0 - 10.5 K/uL Final  . RBC 09/22/2013 4.86  3.87 - 5.11 MIL/uL Final  . Hemoglobin 09/22/2013 13.5  12.0 - 15.0 g/dL Final  . HCT 09/22/2013 40.7  36.0 - 46.0 % Final  . MCV 09/22/2013 83.7  78.0 - 100.0 fL Final  . MCH 09/22/2013 27.8  26.0 - 34.0 pg Final  . MCHC 09/22/2013 33.2  30.0 - 36.0 g/dL Final  . RDW 09/22/2013 13.3  11.5 - 15.5 % Final  . Platelets 09/22/2013 193  150 - 400 K/uL Final  . Sodium 09/22/2013 138  137 - 147 mEq/L Final  . Potassium 09/22/2013 4.2  3.7 - 5.3 mEq/L Final  . Chloride 09/22/2013 99  96 -  112 mEq/L Final  . CO2 09/22/2013 25  19 - 32 mEq/L Final  . Glucose, Bld 09/22/2013 147* 70 - 99 mg/dL Final  . BUN 09/22/2013 9  6 - 23 mg/dL Final  . Creatinine, Ser 09/22/2013 0.44* 0.50 - 1.10 mg/dL Final  . Calcium 09/22/2013 10.0  8.4 - 10.5 mg/dL Final  . Total Protein 09/22/2013 7.5  6.0 - 8.3 g/dL Final  . Albumin 09/22/2013 4.4  3.5 - 5.2  g/dL Final  . AST 09/22/2013 30  0 - 37 U/L Final  . ALT 09/22/2013 69* 0 - 35 U/L Final  . Alkaline Phosphatase 09/22/2013 92  39 - 117 U/L Final  . Total Bilirubin 09/22/2013 0.3  0.3 - 1.2 mg/dL Final  . GFR calc non Af Amer 09/22/2013 >90  >90 mL/min Final  . GFR calc Af Amer 09/22/2013 >90  >90 mL/min Final   Comment: (NOTE)                          The eGFR has been calculated using the CKD EPI equation.                          This calculation has not been validated in all clinical situations.                          eGFR's persistently <90 mL/min signify possible Chronic Kidney                          Disease.  Marland Kitchen Prothrombin Time 09/22/2013 11.8  11.6 - 15.2 seconds Final  . INR 09/22/2013 0.88  0.00 - 1.49 Final  . Color, Urine 09/22/2013 YELLOW  YELLOW Final  . APPearance 09/22/2013 CLEAR  CLEAR Final  . Specific Gravity, Urine 09/22/2013 1.017  1.005 - 1.030 Final  . pH 09/22/2013 5.5  5.0 - 8.0 Final  . Glucose, UA 09/22/2013 NEGATIVE  NEGATIVE mg/dL Final  . Hgb urine dipstick 09/22/2013 NEGATIVE  NEGATIVE Final  . Bilirubin Urine 09/22/2013 NEGATIVE  NEGATIVE Final  . Ketones, ur 09/22/2013 NEGATIVE  NEGATIVE mg/dL Final  . Protein, ur 09/22/2013 100* NEGATIVE mg/dL Final  . Urobilinogen, UA 09/22/2013 0.2  0.0 - 1.0 mg/dL Final  . Nitrite 09/22/2013 NEGATIVE  NEGATIVE Final  . Leukocytes, UA 09/22/2013 SMALL* NEGATIVE Final  . MRSA, PCR 09/22/2013 NEGATIVE  NEGATIVE Final  . Staphylococcus aureus 09/22/2013 NEGATIVE  NEGATIVE Final   Comment:                                 The Xpert SA Assay (FDA                          approved for NASAL specimens                          in patients over 17 years of age),                          is one component of                          a comprehensive surveillance  program.  Test performance has                          been validated by [facility] for patients greater                            than or equal to 68 year old.                          It is not intended                          to diagnose infection nor to                          guide or monitor treatment.  . ABO/RH(D) 09/22/2013 A POS   Final  . Antibody Screen 09/22/2013 NEG   Final  . Sample Expiration 09/22/2013 10/02/2013   Final  . Preg, Serum 09/22/2013 NEGATIVE  NEGATIVE Final   Comment:                                 THE SENSITIVITY OF THIS                          METHODOLOGY IS >10 mIU/mL.  . ABO/RH(D) 09/22/2013 A POS   Final  . Squamous Epithelial / LPF 09/22/2013 FEW* RARE Final  . WBC, UA 09/22/2013 3-6  <3 WBC/hpf Final  . RBC / HPF 09/22/2013 0-2  <3 RBC/hpf Final  . Bacteria, UA 09/22/2013 FEW* RARE Final  . Casts 09/22/2013 HYALINE CASTS* NEGATIVE Final  . Urine-Other 09/22/2013 MUCOUS PRESENT   Final  . Specimen Description 09/22/2013 URINE, RANDOM   Final  . Special Requests 09/22/2013 NONE   Final  . Culture  Setup Time 09/22/2013    Final                   Value:09/22/2013 22:01                         Performed at [facility]  . Colony Count 09/22/2013    Final                   Value:50,000 COLONIES/ML                         Performed at [facility]  . Culture 09/22/2013    Final                   Value:Multiple bacterial morphotypes present, none predominant. Suggest appropriate recollection if clinically indicated.                         Performed at [facility]  . Report Status 09/22/2013 09/23/2013 FINAL   Final     X-Rays:No results found.  EKG:No orders found for this or any previous visit.   Hospital Course: Terri [name] is a 43 y.o. who was admitted to [facility]. They were brought to the operating room on 09/29/2013 and underwent Procedure(s): RIGHT TOTAL KNEE ARTHROPLASTY.  Patient tolerated the procedure well and was later transferred to the recovery room and then to the orthopaedic floor for postoperative  care.  They were given PO and IV analgesics for pain control following their surgery.  They were given 24 hours of postoperative antibiotics of  Anti-infectives   Start     Dose/Rate Route Frequency Ordered Stop   09/29/13 2000  ceFAZolin (ANCEF) IVPB 2 g/50 mL premix     2 g 100 mL/hr over 30 Minutes Intravenous Every 6 hours 09/29/13 1724 09/30/13 0215   09/29/13 1100  ceFAZolin (ANCEF) IVPB 2 g/50 mL premix    Comments:  Dose changed to 2g per P&T policy for weight < 099IP.   2 g 100 mL/hr over 30 Minutes Intravenous On call to O.R. 09/29/13 1048 09/29/13 1424     and started on DVT prophylaxis in the form of Xarelto.   PT and OT were ordered for total joint protocol.  Discharge planning consulted to help with postop disposition and equipment needs.  Patient had a tough night on the evening of surgery. Patient seen in rounds with Dr. Wynelle Link. Patient had a rough night with N/V but better the next morning.  They started to get up OOB with therapy on day one. Hemovac drain was pulled without difficulty.  Continued to work with therapy into day two.  Dressing was changed on day two and the incision was healing well.  Patient was seen in rounds and was ready to go home later that same day on day two.   Discharge Medications: Prior to Admission medications   Medication Sig Start Date End Date Taking? Authorizing Provider  atorvastatin (LIPITOR) 10 MG tablet Take 10 mg by mouth daily.   Yes Historical Provider, MD  clonazePAM (KLONOPIN) 0.5 MG tablet Take 0.5 mg by mouth 3 (three) times daily as needed for anxiety.   Yes Historical Provider, MD  ibuprofen (ADVIL,MOTRIN) 200 MG tablet Take 800 mg by mouth every 6 (six) hours as needed for mild pain.   Yes Historical Provider, MD  metFORMIN (GLUCOPHAGE) 500 MG tablet Take 1,000 mg by mouth 2 (two) times daily with a meal.   Yes Historical Provider, MD  sertraline (ZOLOFT) 50 MG tablet Take 50 mg by mouth every evening.    Yes Historical Provider, MD    aspirin EC 81 MG tablet Take 81 mg by mouth daily.    Historical Provider, MD  methocarbamol (ROBAXIN) 500 MG tablet Take 1 tablet (500 mg total) by mouth every 6 (six) hours as needed for muscle spasms. 10/01/13   Alexzandrew Perkins, PA-C  oxyCODONE (OXY IR/ROXICODONE) 5 MG immediate release tablet Take 1-2 tablets (5-10 mg total) by mouth every 3 (three) hours as needed for breakthrough pain. 10/01/13   Alexzandrew Dara Lords, PA-C  rivaroxaban (XARELTO) 10 MG TABS tablet Take 1 tablet (10 mg total) by mouth daily with breakfast. Take Xarelto for two and a half more weeks, then discontinue Xarelto. Once the patient has completed the Xarelto, they may resume the 81 mg Aspirin. 10/01/13   Alexzandrew Perkins, PA-C  traMADol (ULTRAM) 50 MG tablet Take 1-2 tablets (50-100 mg total) by mouth every 6 (six) hours as needed (mild to moderate pain). 10/01/13   Alexzandrew Dara Lords, PA-C   Discharge home with home health  Diet - Cardiac diet and Diabetic diet  Follow up - in 2 weeks  Activity -  WBAT  Disposition - Home  Condition Upon Discharge - Good  D/C Meds - See DC Summary  DVT Prophylaxis - Xarelto      Discharge Orders   Future Orders Complete By Expires   Call MD / Call 911  As directed    Comments:     If you experience chest pain or shortness of breath, CALL 911 and be transported to the hospital emergency room.  If you develope a fever above 101 F, pus (white drainage) or increased drainage or redness at the wound, or calf pain, call your surgeon's office.   Change dressing  As directed    Comments:     Change dressing daily with sterile 4 x 4 inch gauze dressing and apply TED hose. Do not submerge the incision under water.   Constipation Prevention  As directed    Comments:     Drink plenty of fluids.  Prune juice may be helpful.  You may use a stool softener, such as Colace (over the counter) 100 mg twice a day.  Use MiraLax (over the counter) for constipation as needed.   Diet - low  sodium heart healthy  As directed    Diet Carb Modified  As directed    Discharge instructions  As directed    Comments:     Pick up stool softner and laxative for home. Do not submerge incision under water. May shower. Continue to use ice for pain and swelling from surgery.  Take Xarelto for two and a half more weeks, then discontinue Xarelto. Once the patient has completed the Xarelto, they may resume the 81 mg Aspirin.   Do not put a pillow under the knee. Place it under the heel.  As directed    Do not sit on low chairs, stoools or toilet seats, as it may be difficult to get up from low surfaces  As directed    Driving restrictions  As directed    Comments:     No driving until released by the physician.   Increase activity slowly as tolerated  As directed    Lifting restrictions  As directed    Comments:     No lifting until released by the physician.   Patient may shower  As directed    Comments:     You may shower without a dressing once there is no drainage.  Do not wash over the wound.  If drainage remains, do not shower until drainage stops.   TED hose  As directed    Comments:     Use stockings (TED hose) for 3 weeks on both leg(s).  You may remove them at night for sleeping.   Weight bearing as tolerated  As directed    Questions:     Laterality:     Extremity:         Medication List    STOP taking these medications       aspirin EC 81 MG tablet     ibuprofen 200 MG tablet  Commonly known as:  ADVIL,MOTRIN      TAKE these medications       atorvastatin 10 MG tablet  Commonly known as:  LIPITOR  Take 10 mg by mouth daily.     clonazePAM 0.5 MG tablet  Commonly known as:  KLONOPIN  Take 0.5 mg by mouth 3 (three) times daily as needed for anxiety.     metFORMIN 500 MG tablet  Commonly known as:  GLUCOPHAGE  Take  1,000 mg by mouth 2 (two) times daily with a meal.     methocarbamol 500 MG tablet  Commonly known as:  ROBAXIN  Take 1 tablet (500 mg  total) by mouth every 6 (six) hours as needed for muscle spasms.     oxyCODONE 5 MG immediate release tablet  Commonly known as:  Oxy IR/ROXICODONE  Take 1-2 tablets (5-10 mg total) by mouth every 3 (three) hours as needed for breakthrough pain.     rivaroxaban 10 MG Tabs tablet  Commonly known as:  XARELTO  - Take 1 tablet (10 mg total) by mouth daily with breakfast. Take Xarelto for two and a half more weeks, then discontinue Xarelto.  - Once the patient has completed the Xarelto, they may resume the 81 mg Aspirin.     sertraline 50 MG tablet  Commonly known as:  ZOLOFT  Take 50 mg by mouth every evening.     traMADol 50 MG tablet  Commonly known as:  ULTRAM  Take 1-2 tablets (50-100 mg total) by mouth every 6 (six) hours as needed (mild to moderate pain).       Follow-up Information   Follow up with Gearlean Alf, MD. Schedule an appointment as soon as possible for a visit in 2 weeks.   Specialty:  Orthopedic Surgery   Contact information:   [address] [place] [address] [phone]       Signed: Mickel Crow 10/02/2013, 11:26 AM

## 2013-10-01 NOTE — Progress Notes (Signed)
Physical Therapy Treatment [name] MRN: [id number] DOB: Jun 22, 1971 Today's Date: 10/01/2013 Time: 7035-0093 PT Time Calculation (min): 43 min  PT Assessment / Plan / Recommendation  History of Present Illness s/p RTKA   PT Comments   *Stair training completed, REady to DC home from PT standpoint.**  Follow Up Recommendations  Home health PT     Does the patient have the potential to tolerate intense rehabilitation     Barriers to Discharge        Equipment Recommendations  None recommended by PT    Recommendations for Other Services    Frequency Min 3X/week   Progress towards PT Goals Progress towards PT goals: Progressing toward goals  Plan Current plan remains appropriate    Precautions / Restrictions Precautions Precautions: Knee Required Braces or Orthoses: Knee Immobilizer - Right Knee Immobilizer - Right: Discontinue once straight leg raise with < 10 degree lag   Pertinent Vitals/Pain **5/10 R knee premedicated*    Mobility  Transfers Overall transfer level: Needs assistance Equipment used: Rolling walker (2 wheeled) Transfers: Sit to/from Stand Sit to Stand: Supervision General transfer comment: cues for hand placement and RLE position  Stairs: Yes Stairs assistance: Min assist Stair Management: No rails;Backwards;With walker Number of Stairs: 5 General stair comments: instructed pt/husband in backwards technique with RW, VCs for sequencing, husband assisted with stabilizing RW        PT Diagnosis:    PT Problem List:   PT Treatment Interventions:     PT Goals (current goals can now be found in the care plan section) Acute Rehab PT Goals Patient Stated Goal: to be able to walk PT Goal Formulation: With patient Time For Goal Achievement: 10/03/13 Potential to Achieve Goals: Good  Visit Information  Last PT Received On: 10/01/13 Assistance Needed: +1 History of Present Illness: s/p RTKA    Subjective Data  Patient  Stated Goal: to be able to walk   Cognition  Cognition Arousal/Alertness: Awake/alert Behavior During Therapy: WFL for tasks assessed/performed Overall Cognitive Status: Within Functional Limits for tasks assessed    Balance     End of Session PT - End of Session Equipment Utilized During Treatment: Gait belt Activity Tolerance: Patient limited by pain Patient left: in chair;with call bell/phone within reach Nurse Communication: Mobility status   GP     Blondell Reveal Kistler 10/01/2013, 11:09 AM [phone]

## 2013-10-01 NOTE — Progress Notes (Signed)
   Subjective: 2 Days Post-Op Procedure(s) (LRB): RIGHT TOTAL KNEE ARTHROPLASTY (Right) Patient reports pain as mild.   Patient seen in rounds with Dr. Wynelle Link. Patient is well, but has had some minor complaints of pain in the knee, requiring pain medications Patient is ready to go home today  Objective: Vital signs in last 24 hours: Temp:  [98 F (36.7 C)-98.3 F (36.8 C)] 98.3 F (36.8 C) (01/14 0600) Pulse Rate:  [70-93] 93 (01/14 0600) Resp:  [16-18] 17 (01/14 0600) BP: (121-146)/(77-83) 146/83 mmHg (01/14 0600) SpO2:  [90 %-98 %] 93 % (01/14 0600)  Intake/Output from previous day:  Intake/Output Summary (Last 24 hours) at 10/01/13 0750 Last data filed at 09/30/13 2020  Gross per 24 hour  Intake 1526.25 ml  Output    600 ml  Net 926.25 ml    Intake/Output this shift:    Labs:  Recent Labs  09/30/13 0550  HGB 11.5*    Recent Labs  09/30/13 0550  WBC 11.1*  RBC 4.13  HCT 34.4*  PLT 177    Recent Labs  09/30/13 0550 10/01/13 0508  NA 139 134*  K 4.3 3.9  CL 101 97  CO2 26 25  BUN 14 12  CREATININE 0.41* 0.46*  GLUCOSE 197* 206*  CALCIUM 8.6 8.6   No results found for this basename: LABPT, INR,  in the last 72 hours  EXAM: General - Patient is Alert, Appropriate and Oriented Extremity - Neurovascular intact Sensation intact distally Incision - clean, dry, no drainage, healing Motor Function - intact, moving foot and toes well on exam.   Assessment/Plan: 2 Days Post-Op Procedure(s) (LRB): RIGHT TOTAL KNEE ARTHROPLASTY (Right) Procedure(s) (LRB): RIGHT TOTAL KNEE ARTHROPLASTY (Right) Past Medical History  Diagnosis Date  . Hyperlipidemia   . Anxiety   . Arthritis     knee pain  . Complication of anesthesia     SLIGHT NAUSEA  . Diabetes mellitus without complication   . Diastasis recti    Principal Problem:   OA (osteoarthritis) of knee  Estimated body mass index is 36.56 kg/(m^2) as calculated from the following:   Height as of  this encounter: 5\' 7"  (1.702 m).   Weight as of this encounter: 105.915 kg (233 lb 8 oz). Up with therapy Discharge home with home health Diet - Cardiac diet and Diabetic diet Follow up - in 2 weeks Activity - WBAT Disposition - Home Condition Upon Discharge - Good D/C Meds - See DC Summary DVT Prophylaxis - Xarelto  Calleen Alvis 10/01/2013, 7:50 AM

## 2013-10-02 DIAGNOSIS — E871 Hypo-osmolality and hyponatremia: Secondary | ICD-10-CM

## 2013-10-02 DIAGNOSIS — D62 Acute posthemorrhagic anemia: Secondary | ICD-10-CM

## 2013-11-10 ENCOUNTER — Encounter (HOSPITAL_COMMUNITY): Payer: Self-pay | Admitting: Pharmacy Technician

## 2013-11-10 ENCOUNTER — Encounter (HOSPITAL_COMMUNITY): Payer: Self-pay | Admitting: *Deleted

## 2013-11-10 NOTE — Progress Notes (Signed)
Need orders in EPIC.  Surgery scheduled for 11/12/12.  Thank You.

## 2013-11-11 ENCOUNTER — Other Ambulatory Visit: Payer: Self-pay | Admitting: Orthopedic Surgery

## 2013-11-11 DIAGNOSIS — T84018A Broken internal joint prosthesis, other site, initial encounter: Secondary | ICD-10-CM | POA: Diagnosis present

## 2013-11-11 DIAGNOSIS — Z96659 Presence of unspecified artificial knee joint: Secondary | ICD-10-CM

## 2013-11-11 NOTE — H&P (Signed)
  CC- Terri [name] is a 43 y.o. female who presents with right knee stiffness.  HPI- . Knee Pain: Patient presents with stiffness involving the  right knee. Onset of the symptoms was several weeks ago. Inciting event: She underwent right total knee arthroplasty on 09/29/13 and has had difficulty regaining her range of motion despite adequate Physical Therapy. She has not yet surpassed 90 degrees of flexion and is limited by her range of motion. She has not progressed sufficiently with physical therapy and presents now for closed manipulation right knee. Current symptoms include stiffness. Pain is aggravated by standing, walking and attempting to flex knee.  Patient has had prior knee problems.Treatment to date: PT which was not very effective.  Past Medical History  Diagnosis Date  . Hyperlipidemia   . Anxiety   . Arthritis     knee pain  . Complication of anesthesia     SLIGHT NAUSEA  . Diabetes mellitus without complication   . Diastasis recti     Past Surgical History  Procedure Laterality Date  . Knee surgery      bilateral meniscus  . Cesarean section  1998  . Total knee arthroplasty Right 09/29/2013    Procedure: RIGHT TOTAL KNEE ARTHROPLASTY;  Surgeon: Gearlean Alf, MD;  Location: [facility];  Service: Orthopedics;  Laterality: Right;    Prior to Admission medications   Medication Sig Start Date End Date Taking? Authorizing Provider  atorvastatin (LIPITOR) 20 MG tablet Take 20 mg by mouth every evening.    Historical Provider, MD  clonazePAM (KLONOPIN) 0.5 MG tablet Take 0.5 mg by mouth 3 (three) times daily as needed for anxiety.    Historical Provider, MD  metFORMIN (GLUCOPHAGE) 1000 MG tablet Take 1,000 mg by mouth 2 (two) times daily with a meal.    Historical Provider, MD  methocarbamol (ROBAXIN) 500 MG tablet Take 500 mg by mouth 3 (three) times daily.    Historical Provider, MD  oxycodone (OXY-IR) 5 MG capsule Take 5-10 mg by mouth every 4 (four) hours as needed for pain.     Historical Provider, MD  sertraline (ZOLOFT) 50 MG tablet Take 50 mg by mouth every evening.     Historical Provider, MD  traMADol (ULTRAM) 50 MG tablet Take 50-100 mg by mouth every 6 (six) hours as needed for moderate pain or severe pain.    Historical Provider, MD   Knee Exam No warmth or erythema right knee; mild swelling; range of motion 5-90 with firm endpoint; no laxity  Physical Examination: General appearance - alert, well appearing, and in no distress Mental status - alert, oriented to person, place, and time Chest - clear to auscultation, no wheezes, rales or rhonchi, symmetric air entry Heart - normal rate, regular rhythm, normal S1, S2, no murmurs, rubs, clicks or gallops Abdomen - soft, nontender, nondistended, no masses or organomegaly Neurological - alert, oriented, normal speech, no focal findings or movement disorder noted   Asessment/Plan--- Rightt knee arthrofibrosis- - Plan right knee closed manipulation. Procedure risks and potential comps discussed with patient who elects to proceed. Goals are decreased pain and increased function with a high likelihood of achieving both

## 2013-11-12 ENCOUNTER — Encounter (HOSPITAL_COMMUNITY): Payer: 59 | Admitting: Anesthesiology

## 2013-11-12 ENCOUNTER — Ambulatory Visit (HOSPITAL_COMMUNITY)
Admission: RE | Admit: 2013-11-12 | Discharge: 2013-11-12 | Disposition: A | Discharge status: Home / Self Care | Payer: 59 | Source: Ambulatory Visit | Attending: Orthopedic Surgery | Admitting: Orthopedic Surgery

## 2013-11-12 ENCOUNTER — Encounter (HOSPITAL_COMMUNITY)
Admission: RE | Disposition: A | Discharge status: Home / Self Care | Payer: Self-pay | Source: Ambulatory Visit | Attending: Orthopedic Surgery

## 2013-11-12 ENCOUNTER — Ambulatory Visit (HOSPITAL_COMMUNITY): Payer: 59 | Admitting: Anesthesiology

## 2013-11-12 ENCOUNTER — Encounter (HOSPITAL_COMMUNITY): Payer: Self-pay | Admitting: *Deleted

## 2013-11-12 DIAGNOSIS — T8489XA Other specified complication of internal orthopedic prosthetic devices, implants and grafts, initial encounter: Secondary | ICD-10-CM

## 2013-11-12 DIAGNOSIS — E119 Type 2 diabetes mellitus without complications: Secondary | ICD-10-CM | POA: Insufficient documentation

## 2013-11-12 DIAGNOSIS — Z96659 Presence of unspecified artificial knee joint: Secondary | ICD-10-CM | POA: Diagnosis present

## 2013-11-12 DIAGNOSIS — D649 Anemia, unspecified: Secondary | ICD-10-CM | POA: Insufficient documentation

## 2013-11-12 DIAGNOSIS — M25669 Stiffness of unspecified knee, not elsewhere classified: Secondary | ICD-10-CM

## 2013-11-12 DIAGNOSIS — K219 Gastro-esophageal reflux disease without esophagitis: Secondary | ICD-10-CM | POA: Insufficient documentation

## 2013-11-12 DIAGNOSIS — Z79899 Other long term (current) drug therapy: Secondary | ICD-10-CM | POA: Insufficient documentation

## 2013-11-12 DIAGNOSIS — F411 Generalized anxiety disorder: Secondary | ICD-10-CM | POA: Insufficient documentation

## 2013-11-12 DIAGNOSIS — E669 Obesity, unspecified: Secondary | ICD-10-CM | POA: Insufficient documentation

## 2013-11-12 DIAGNOSIS — M2460 Ankylosis, unspecified joint: Principal | ICD-10-CM

## 2013-11-12 DIAGNOSIS — Z87891 Personal history of nicotine dependence: Secondary | ICD-10-CM | POA: Insufficient documentation

## 2013-11-12 DIAGNOSIS — E785 Hyperlipidemia, unspecified: Secondary | ICD-10-CM | POA: Insufficient documentation

## 2013-11-12 DIAGNOSIS — T84018A Broken internal joint prosthesis, other site, initial encounter: Secondary | ICD-10-CM | POA: Diagnosis present

## 2013-11-12 DIAGNOSIS — M24669 Ankylosis, unspecified knee: Secondary | ICD-10-CM | POA: Insufficient documentation

## 2013-11-12 HISTORY — PX: KNEE CLOSED REDUCTION: SHX995

## 2013-11-12 LAB — BASIC METABOLIC PANEL
BUN: 8 mg/dL (ref 6–23)
CHLORIDE: 99 meq/L (ref 96–112)
CO2: 27 mEq/L (ref 19–32)
CREATININE: 0.48 mg/dL — AB (ref 0.50–1.10)
Calcium: 9.7 mg/dL (ref 8.4–10.5)
GFR calc Af Amer: >90 mL/min (ref >90)
GFR calc non Af Amer: >90 mL/min (ref >90)
Glucose, Bld: 142 mg/dL — ABNORMAL HIGH (ref 70–99)
Potassium: 4 mEq/L (ref 3.7–5.3)
Sodium: 139 mEq/L (ref 137–147)

## 2013-11-12 LAB — CBC
HEMATOCRIT: 38.7 % (ref 36.0–46.0)
Hemoglobin: 12.7 g/dL (ref 12.0–15.0)
MCH: 27.1 pg (ref 26.0–34.0)
MCHC: 32.8 g/dL (ref 30.0–36.0)
MCV: 82.5 fL (ref 78.0–100.0)
Platelets: 205 10*3/uL (ref 150–400)
RBC: 4.69 MIL/uL (ref 3.87–5.11)
RDW: 13.4 % (ref 11.5–15.5)
WBC: 5.4 10*3/uL (ref 4.0–10.5)

## 2013-11-12 LAB — GLUCOSE, CAPILLARY
Glucose-Capillary: 140 mg/dL — ABNORMAL HIGH (ref 70–99)
Glucose-Capillary: 131 mg/dL — ABNORMAL HIGH (ref 70–99)

## 2013-11-12 LAB — HCG, SERUM, QUALITATIVE: Preg, Serum: NEGATIVE

## 2013-11-12 SURGERY — MANIPULATION, KNEE, CLOSED
Anesthesia: General | Site: Knee | Laterality: Right

## 2013-11-12 MED ORDER — LACTATED RINGERS IV SOLN
INTRAVENOUS | Status: DC | PRN
  Administered 2013-11-12: 09:00:00 via INTRAVENOUS

## 2013-11-12 MED ORDER — MIDAZOLAM HCL 2 MG/2ML IJ SOLN
INTRAMUSCULAR | Status: AC
Start: 2013-11-12 — End: 2013-11-12
  Filled 2013-11-12: qty 2

## 2013-11-12 MED ORDER — PROPOFOL 10 MG/ML IV BOLUS
INTRAVENOUS | Status: DC | PRN
  Administered 2013-11-12: 150 mg via INTRAVENOUS

## 2013-11-12 MED ORDER — SODIUM CHLORIDE 0.9 % IV SOLN: INTRAVENOUS | Status: DC

## 2013-11-12 MED ORDER — PROPOFOL 10 MG/ML IV BOLUS
INTRAVENOUS | Status: AC
  Filled 2013-11-12: qty 20

## 2013-11-12 MED ORDER — HYDROMORPHONE HCL PF 1 MG/ML IJ SOLN
INTRAMUSCULAR | Status: AC
  Filled 2013-11-12: qty 1

## 2013-11-12 MED ORDER — FENTANYL CITRATE 0.05 MG/ML IJ SOLN
INTRAMUSCULAR | Status: AC
  Filled 2013-11-12: qty 2

## 2013-11-12 MED ORDER — OXYCODONE HCL 5 MG PO CAPS
5.0000 mg | ORAL_CAPSULE | Freq: Four times a day (QID) | ORAL | Status: DC | PRN
Start: 2013-11-12 — End: 2015-03-17

## 2013-11-12 MED ORDER — PROMETHAZINE HCL 25 MG/ML IJ SOLN: 6.2500 mg | INTRAMUSCULAR | Status: DC | PRN

## 2013-11-12 MED ORDER — METHOCARBAMOL 100 MG/ML IJ SOLN
500.0000 mg | Freq: Once | INTRAVENOUS | Status: AC
  Administered 2013-11-12: 500 mg via INTRAVENOUS
  Filled 2013-11-12: qty 5

## 2013-11-12 MED ORDER — LACTATED RINGERS IV SOLN
INTRAVENOUS | Status: DC
  Administered 2013-11-12: 1000 mL via INTRAVENOUS

## 2013-11-12 MED ORDER — CHLORHEXIDINE GLUCONATE 4 % EX LIQD: 60.0000 mL | Freq: Once | CUTANEOUS | Status: DC

## 2013-11-12 MED ORDER — HYDROMORPHONE HCL PF 1 MG/ML IJ SOLN
0.2500 mg | INTRAMUSCULAR | Status: DC | PRN
  Administered 2013-11-12: 0.25 mg via INTRAVENOUS
  Administered 2013-11-12: 0.5 mg via INTRAVENOUS
  Administered 2013-11-12: 0.25 mg via INTRAVENOUS
  Administered 2013-11-12 (×2): 0.5 mg via INTRAVENOUS

## 2013-11-12 MED ORDER — BUPIVACAINE LIPOSOME 1.3 % IJ SUSP
20.0000 mL | Freq: Once | INTRAMUSCULAR | Status: DC
  Filled 2013-11-12: qty 20

## 2013-11-12 MED ORDER — OXYCODONE HCL 5 MG PO TABS: 5.0000 mg | ORAL_TABLET | Freq: Once | ORAL | Status: DC

## 2013-11-12 MED ORDER — MIDAZOLAM HCL 5 MG/5ML IJ SOLN
INTRAMUSCULAR | Status: DC | PRN
  Administered 2013-11-12: 2 mg via INTRAVENOUS

## 2013-11-12 MED ORDER — ACETAMINOPHEN 10 MG/ML IV SOLN
1000.0000 mg | Freq: Once | INTRAVENOUS | Status: AC
  Administered 2013-11-12: 1000 mg via INTRAVENOUS
  Filled 2013-11-12: qty 100

## 2013-11-12 MED ORDER — FENTANYL CITRATE 0.05 MG/ML IJ SOLN
INTRAMUSCULAR | Status: DC | PRN
  Administered 2013-11-12: 50 ug via INTRAVENOUS
  Administered 2013-11-12 (×2): 25 ug via INTRAVENOUS

## 2013-11-12 MED ORDER — LACTATED RINGERS IV SOLN: INTRAVENOUS | Status: DC

## 2013-11-12 NOTE — Anesthesia Postprocedure Evaluation (Signed)
  Anesthesia Post-op Note  Patient: Terri [name]  Procedure(s) Performed: Procedure(s) (LRB): RIGHT CLOSED MANIPULATION KNEE (Right)  Patient Location: PACU  Anesthesia Type: General  Level of Consciousness: awake and alert   Airway and Oxygen Therapy: Patient Spontanous Breathing  Post-op Pain: mild  Post-op Assessment: Post-op Vital signs reviewed, Patient's Cardiovascular Status Stable, Respiratory Function Stable, Patent Airway and No signs of Nausea or vomiting  Last Vitals:  Filed Vitals:   11/12/13 1132  BP: 142/78  Pulse: 76  Temp: 36.3 C  Resp: 16    Post-op Vital Signs: stable   Complications: No apparent anesthesia complications

## 2013-11-12 NOTE — Op Note (Signed)
  OPERATIVE REPORT   PREOPERATIVE DIAGNOSIS: Arthrofibrosis, Right  knee.   POSTOPERATIVE DIAGNOSIS: Arthrofibrosis, Right knee.   PROCEDURE:  Right  knee closed manipulation.   SURGEON: Johnedward Brodrick, MD   ASSISTANT: None.   ANESTHESIA: General.   COMPLICATIONS: None.   CONDITION: Stable to Recovery.   Pre-manipulation range of motion is 5-90.  Post-manipulation range of  Motion is 0-130  PROCEDURE IN DETAIL: After successful administration of general  anesthetic, exam under anesthesia was performed showing range of motion  5-90 degrees. I then placed my chest against the proximal tibia,  flexing the knee with audible lysis of adhesions. I was easily able to  get the knee flexed to 130  degrees. I then put the knee back in extension and with some  patellar manipulation and gentle pressure got to full  Extension.The patient was subsequently awakened and transported to Recovery in  stable condition.    

## 2013-11-12 NOTE — Transfer of Care (Deleted)
Immediate Anesthesia Transfer of Care Note  Patient: Terri [name]  Procedure(s) Performed: Procedure(s) (LRB): RIGHT CLOSED MANIPULATION KNEE (Right)  Patient Location: PACU  Anesthesia Type: General  Level of Consciousness: sedated, patient cooperative and responds to stimulation  Airway & Oxygen Therapy: Patient Spontanous Breathing and Patient connected to face mask oxgen  Post-op Assessment: Report given to PACU RN and Post -op Vital signs reviewed and stable  Post vital signs: Reviewed and stable  Complications: No apparent anesthesia complications  

## 2013-11-12 NOTE — Transfer of Care (Signed)
Immediate Anesthesia Transfer of Care Note  Patient: Terri [name]  Procedure(s) Performed: Procedure(s) (LRB): RIGHT CLOSED MANIPULATION KNEE (Right)  Patient Location: PACU  Anesthesia Type: General  Level of Consciousness: sedated, patient cooperative and responds to stimulation  Airway & Oxygen Therapy: Patient Spontanous Breathing and Patient connected to face mask oxgen  Post-op Assessment: Report given to PACU RN and Post -op Vital signs reviewed and stable  Post vital signs: Reviewed and stable  Complications: No apparent anesthesia complications

## 2013-11-12 NOTE — Interval H&P Note (Signed)
History and Physical Interval Note:  0/94/0768 0:88 AM  Terri [name]  has presented today for surgery, with the diagnosis of RIGHT KNEE ARTHROFIBROSIS   The various methods of treatment have been discussed with the patient and family. After consideration of risks, benefits and other options for treatment, the patient has consented to  Procedure(s): RIGHT CLOSED MANIPULATION KNEE (Right) as a surgical intervention .  The patient's history has been reviewed, patient examined, no change in status, stable for surgery.  I have reviewed the patient's chart and labs.  Questions were answered to the patient's satisfaction.     Gearlean Alf

## 2013-11-12 NOTE — Anesthesia Preprocedure Evaluation (Signed)
Anesthesia Evaluation  Patient identified by MRN, date of birth, ID band Patient awake    Reviewed: Allergy & Precautions, H&P , NPO status , Patient's Chart, lab work & pertinent test results  History of Anesthesia Complications (+) PONV and history of anesthetic complications  Airway Mallampati: II TM Distance: >3 FB Neck ROM: Full    Dental no notable dental hx.    Pulmonary neg pulmonary ROS, former smoker,  breath sounds clear to auscultation  Pulmonary exam normal       Cardiovascular Exercise Tolerance: Good negative cardio ROS  Rhythm:Regular Rate:Normal     Neuro/Psych Anxiety  Neuromuscular disease    GI/Hepatic Neg liver ROS, GERD-  ,  Endo/Other  diabetes, Type 2, Oral Hypoglycemic Agents  Renal/GU negative Renal ROS  negative genitourinary   Musculoskeletal negative musculoskeletal ROS (+)   Abdominal (+) + obese,   Peds negative pediatric ROS (+)  Hematology  (+) anemia ,   Anesthesia Other Findings   Reproductive/Obstetrics negative OB ROS                           Anesthesia Physical Anesthesia Plan  ASA: II  Anesthesia Plan: General   Post-op Pain Management:    Induction: Intravenous  Airway Management Planned: LMA  Additional Equipment:   Intra-op Plan:   Post-operative Plan: Extubation in OR  Informed Consent: I have reviewed the patients History and Physical, chart, labs and discussed the procedure including the risks, benefits and alternatives for the proposed anesthesia with the patient or authorized representative who has indicated his/her understanding and acceptance.   Dental advisory given  Plan Discussed with: CRNA  Anesthesia Plan Comments:         Anesthesia Quick Evaluation

## 2013-11-14 ENCOUNTER — Encounter (HOSPITAL_COMMUNITY): Payer: Self-pay | Admitting: Orthopedic Surgery

## 2015-03-18 ENCOUNTER — Encounter: Payer: Self-pay | Admitting: Neurology

## 2015-03-18 ENCOUNTER — Ambulatory Visit (INDEPENDENT_AMBULATORY_CARE_PROVIDER_SITE_OTHER): Payer: 59 | Admitting: Neurology

## 2015-03-18 VITALS — BP 122/82 | HR 76 | Resp 16 | Ht 66.5 in | Wt 222.0 lb

## 2015-03-18 DIAGNOSIS — R519 Headache, unspecified: Secondary | ICD-10-CM

## 2015-03-18 DIAGNOSIS — G4761 Periodic limb movement disorder: Secondary | ICD-10-CM | POA: Diagnosis not present

## 2015-03-18 DIAGNOSIS — R0683 Snoring: Secondary | ICD-10-CM

## 2015-03-18 DIAGNOSIS — R51 Headache: Secondary | ICD-10-CM

## 2015-03-18 DIAGNOSIS — E669 Obesity, unspecified: Secondary | ICD-10-CM

## 2015-03-18 DIAGNOSIS — R351 Nocturia: Secondary | ICD-10-CM | POA: Diagnosis not present

## 2015-03-18 DIAGNOSIS — G4719 Other hypersomnia: Secondary | ICD-10-CM

## 2015-03-18 NOTE — Progress Notes (Signed)
Subjective:    Patient ID: Terri [name] is a 44 y.o. female.  HPI     Star Age, MD, PhD [facility] [address], [address] [place], [place] [address]  Dear Ulice Dash,   I saw your patient, Terri [name], upon your kind request, in my neurologic clinic today for initial consultation of her sleep disorder, in particular, concern for underlying obstructive sleep apnea. The patient is unaccompanied today. As you know, Terri [name] is a 44 year old right-handed woman with an underlying medical history of arthritis, status post right total knee replacement on 09/29/2013, obesity, atypical chest pain, mixed hyperlipidemia, type 2 diabetes, anxiety, and history of panic attacks, who reports snoring, non restorative sleep, gasping sensations while asleep, morning headaches of about 1-2 times per week, nocturia of 1 time per night and excessive daytime somnolence. Her ESS is 19/24 and her FSS is 45/63.  She works in Therapist, art at [employer]. She has residual knee soreness and wears a brace during the night. She feels like she moves her legs in her sleep. Snoring can be allowed per children. Her youngest is 42 years old. She lives with her children and her husband. She states she is going through a divorce. In November 2015 she had a panic attack at work. She had some associated discomfort in her left arm and left side of the neck and was therefore referred to you for further evaluation. She had cardiac workup. She quit smoking almost 20 years ago. She rarely drinks alcohol, maybe once a year or so. She drinks coffee in the morning usually 1 cup and one or 2 glasses of on suite and tea per day. She has trouble going to sleep at times.  I reviewed your office note from 02/05/2015, which you kindly included. She had a nuclear stress test on 08/03/2014 which showed no evidence of ischemia. She has had some medication changes for her diabetes. She has been able to lose some weight  since then. Her blood sugar values have improved as well.  Her Past Medical History Is Significant For: Past Medical History  Diagnosis Date  . Hyperlipidemia   . Anxiety   . Arthritis     knee pain  . Complication of anesthesia     SLIGHT NAUSEA  . Diabetes mellitus without complication   . Diastasis recti   . Malaise and fatigue   . Snoring   . Panic attack   . Atypical chest pain   . Moderate obesity   . Headache     Her Past Surgical History Is Significant For: Past Surgical History  Procedure Laterality Date  . Knee surgery      bilateral meniscus  . Cesarean section  1998  . Total knee arthroplasty Right 09/29/2013    Procedure: RIGHT TOTAL KNEE ARTHROPLASTY;  Surgeon: Gearlean Alf, MD;  Location: [facility];  Service: Orthopedics;  Laterality: Right;  . Knee closed reduction Right 11/12/2013    Procedure: RIGHT CLOSED MANIPULATION KNEE;  Surgeon: Gearlean Alf, MD;  Location: [facility];  Service: Orthopedics;  Laterality: Right;    Her Family History Is Significant For: Family History  Problem Relation Age of Onset  . Hypertension Mother   . Hyperlipidemia Father   . Diabetes Father   . Stroke Father     2011    Her Social History Is Significant For: History   Social History  . Marital Status: Married    Spouse Name: N/A  . Number of  Children: 3  . Years of Education: college   Social History Main Topics  . Smoking status: Former Smoker    Quit date: 02/17/1996  . Smokeless tobacco: Not on file  . Alcohol Use: 0.0 oz/week    0 Standard drinks or equivalent per week     Comment: social  . Drug Use: No  . Sexual Activity:    Partners: Male   Other Topics Concern  . None   Social History Narrative   1 cup of coffee a day     Her Allergies Are:  No Known Allergies:   Her Current Medications Are:  Outpatient Encounter Prescriptions as of 03/18/2015  Medication Sig  . aspirin 81 MG tablet Take 81 mg by mouth daily.  Marland Kitchen atorvastatin (LIPITOR) 40  MG tablet Take 40 mg by mouth daily.  . clonazePAM (KLONOPIN) 1 MG tablet Take 1 mg by mouth 3 (three) times daily as needed for anxiety.  . CONTRAVE 8-90 MG TB12 Take 2 tablets by mouth 2 (two) times daily.  Marland Kitchen lisinopril (PRINIVIL,ZESTRIL) 20 MG tablet Take 20 mg by mouth at bedtime.  . metFORMIN (GLUCOPHAGE) 1000 MG tablet Take 1,000 mg by mouth 2 (two) times daily with a meal.  . sertraline (ZOLOFT) 100 MG tablet   . sertraline (ZOLOFT) 50 MG tablet Take 50 mg by mouth every evening.    No facility-administered encounter medications on file as of 03/18/2015.  :  Review of Systems:  Out of a complete 14 point review of systems, all are reviewed and negative with the exception of these symptoms as listed below:   Review of Systems  Constitutional: Positive for fatigue.       Weight gain   Endocrine: Positive for polydipsia.       Feeling hot, flushing   Musculoskeletal:       Joint pain, aching muscles   Neurological:       Snoring, No trouble falling asleep, trouble staying asleep, wakes up feeling tired, wakes up choking in the night, daytime tiredness, denies taking naps.   Psychiatric/Behavioral:       Depression, anxiety, not enough sleep     Objective:  Neurologic Exam  Physical Exam Physical Examination:   Filed Vitals:   03/18/15 1449  BP: 122/82  Pulse: 76  Resp: 16    General Examination: The patient is a very pleasant 44 y.o. female in no acute distress. She appears well-developed and well-nourished and well groomed. She is obese.   HEENT: Normocephalic, atraumatic, pupils are equal, round and reactive to light and accommodation. Funduscopic exam is normal with sharp disc margins noted. Extraocular tracking is good without limitation to gaze excursion or nystagmus noted. Normal smooth pursuit is noted. Hearing is grossly intact. Tympanic membranes are clear bilaterally. Face is symmetric with normal facial animation and normal facial sensation. Speech is clear  with no dysarthria noted. There is no hypophonia. There is no lip, neck/head, jaw or voice tremor. Neck is supple with full range of passive and active motion. There are no carotid bruits on auscultation. Oropharynx exam reveals: moderate mouth dryness, adequate dental hygiene and mild airway crowding, due to narrow airway entry and redundant soft palate as well as tonsils in place.. Mallampati is class II. Tongue protrudes centrally and palate elevates symmetrically. Tonsils are 1+ in size/absent. Neck size is 16.25 inches. She has a Absent overbite. Nasal inspection reveals no significant nasal mucosal bogginess or redness and no septal deviation.   Chest: Clear to auscultation  without wheezing, rhonchi or crackles noted.  Heart: S1+S2+0, regular and normal without murmurs, rubs or gallops noted.   Abdomen: Soft, non-tender and non-distended with normal bowel sounds appreciated on auscultation.  Extremities: There is significant erythema in her face and her neck area. She says this is from chronic sun exposure. There is no pitting edema in the distal lower extremities bilaterally. Pedal pulses are intact.  Skin: Warm and dry without trophic changes noted. There are no varicose veins.  Musculoskeletal: exam reveals no obvious joint deformities, tenderness or joint swelling or erythema, with the exception of right knee pain and she wears a brace.   Neurologically:  Mental status: The patient is awake, alert and oriented in all 4 spheres. Her immediate and remote memory, attention, language skills and fund of knowledge are appropriate. There is no evidence of aphasia, agnosia, apraxia or anomia. Speech is clear with normal prosody and enunciation. Thought process is linear. Mood is normal and affect is normal.  Cranial nerves II - XII are as described above under HEENT exam. In addition: shoulder shrug is normal with equal shoulder height noted. Motor exam: Normal bulk, strength and tone is noted.  There is no drift, tremor or rebound. Romberg is negative. Reflexes are 2+ throughout. Babinski: Toes are flexor bilaterally. Fine motor skills and coordination: intact with normal finger taps, normal hand movements, normal rapid alternating patting, normal foot taps and normal foot agility.  Cerebellar testing: No dysmetria or intention tremor on finger to nose testing. Heel to shin is  unremarkable on the left and difficult with the right. there is no truncal or gait ataxia.  Sensory exam: intact to light touch, pinprick, vibration, temperature sense in the upper and lower extremities.  Gait, station and balance: She stands with difficulty. No veering to one side is noted. No leaning to one side is noted. Posture is age-appropriate and stance is narrow based. Gait shows normal stride length and normal pace. No problems turning are noted. She turns en bloc. Tandem walk is unremarkable.  Assessment and Plan:   In summary, Terri [name] is a very pleasant 44 y.o.-year old female with an underlying medical history of arthritis, status post right total knee replacement on 09/29/2013, obesity, atypical chest pain, mixed hyperlipidemia, type 2 diabetes, anxiety, and history of panic attacks, who reports snoring, non restorative sleep, gasping sensations while asleep, morning headaches of about 1-2 times per week, nocturia of 1 time per night and excessive daytime somnolence. Her history and physical exam are concerning for underlying obstructive sleep apnea (OSA). She also reports having to move her legs at night which is in keeping with restless leg symptoms and possible periodic leg movements in sleep. I had a long chat with the patient about my findings and the diagnosis of OSA, its prognosis and treatment options. We talked about medical treatments, surgical interventions and non-pharmacological approaches. I explained in particular the risks and ramifications of untreated moderate to severe OSA, especially  with respect to developing cardiovascular disease down the Road, including congestive heart failure, difficult to treat hypertension, cardiac arrhythmias, or stroke. Even type 2 diabetes has, in part, been linked to untreated OSA. Symptoms of untreated OSA include daytime sleepiness, memory problems, mood irritability and mood disorder such as depression and anxiety, lack of energy, as well as recurrent headaches, especially morning headaches. We talked about trying to maintain a healthy lifestyle in general, as well as the importance of weight control. I encouraged the patient to eat healthy, exercise daily  and keep well hydrated, to keep a scheduled bedtime and wake time routine, to not skip any meals and eat healthy snacks in between meals. I advised the patient not to drive when feeling sleepy. I recommended the following at this time: sleep study with potential positive airway pressure titration. (We will score hypopneas at 4% and split the sleep study into diagnostic and treatment portion, if the estimated. 2 hour AHI is >20/h).   I explained the sleep test procedure to the patient and also outlined possible surgical and non-surgical treatment options of OSA, including the use of a custom-made dental device (which would require a referral to a specialist dentist or oral surgeon), upper airway surgical options, such as pillar implants, radiofrequency surgery, tongue base surgery, and UPPP (which would involve a referral to an ENT surgeon). Rarely, jaw surgery such as mandibular advancement may be considered.  I also explained the CPAP treatment option to the patient, who indicated that she would be willing to try CPAP if the need arises. I explained the importance of being compliant with PAP treatment, not only for insurance purposes but primarily to improve Her symptoms, and for the patient's long term health benefit, including to reduce Her cardiovascular risks. I answered all her questions today and the  patient was in agreement. I would like to see her back after the sleep study is completed and encouraged her to call with any interim questions, concerns, problems or updates.   Thank you very much for allowing me to participate in the care of this nice patient. If I can be of any further assistance to you please do not hesitate to call me at [phone].  Sincerely,   Star Age, MD, PhD

## 2015-03-18 NOTE — Patient Instructions (Signed)

## 2015-05-03 ENCOUNTER — Ambulatory Visit (INDEPENDENT_AMBULATORY_CARE_PROVIDER_SITE_OTHER): Payer: 59 | Admitting: Neurology

## 2015-05-03 DIAGNOSIS — G4710 Hypersomnia, unspecified: Secondary | ICD-10-CM

## 2015-05-03 DIAGNOSIS — G4730 Sleep apnea, unspecified: Secondary | ICD-10-CM

## 2015-05-03 DIAGNOSIS — G4733 Obstructive sleep apnea (adult) (pediatric): Secondary | ICD-10-CM

## 2015-05-03 DIAGNOSIS — G4720 Circadian rhythm sleep disorder, unspecified type: Secondary | ICD-10-CM

## 2015-05-03 DIAGNOSIS — G4734 Idiopathic sleep related nonobstructive alveolar hypoventilation: Secondary | ICD-10-CM

## 2015-05-04 NOTE — Sleep Study (Signed)
Please see the scanned sleep study interpretation located in the Procedure tab within the Chart Review section. 

## 2015-05-06 ENCOUNTER — Telehealth: Payer: Self-pay | Admitting: Neurology

## 2015-05-06 DIAGNOSIS — G4733 Obstructive sleep apnea (adult) (pediatric): Secondary | ICD-10-CM

## 2015-05-06 DIAGNOSIS — G4734 Idiopathic sleep related nonobstructive alveolar hypoventilation: Secondary | ICD-10-CM

## 2015-05-06 NOTE — Telephone Encounter (Signed)
Diana: pls fax results to Dr. Einar Gip and PCP.

## 2015-05-06 NOTE — Telephone Encounter (Signed)
Patient seen on 03/18/15, PSG on 05/03/15. Ins: [employer] Please call and notify the patient that the recent sleep study did confirm the diagnosis of obstructive sleep apnea and that I recommend treatment for this in the form of CPAP, in particular, due to lower trending oxygen saturations throughout the night, much worse in dream sleep (O2 nadir of 75%). This will require a repeat sleep study for proper titration and mask fitting. Please explain to patient and arrange for a CPAP titration study. I have placed an order in the chart. Thanks, and please route to [facility] for scheduling. Star Age, MD, PhD [facility])

## 2015-05-07 NOTE — Telephone Encounter (Signed)
I spoke to patient and she is aware of results and would like to do 2nd study. I will send report to PCP and Dr. Einar Gip.

## 2015-06-07 ENCOUNTER — Ambulatory Visit (INDEPENDENT_AMBULATORY_CARE_PROVIDER_SITE_OTHER): Payer: 59 | Admitting: Neurology

## 2015-06-07 DIAGNOSIS — G4720 Circadian rhythm sleep disorder, unspecified type: Secondary | ICD-10-CM

## 2015-06-07 DIAGNOSIS — G4733 Obstructive sleep apnea (adult) (pediatric): Secondary | ICD-10-CM | POA: Diagnosis not present

## 2015-06-07 NOTE — Sleep Study (Signed)
Please see the scanned sleep study interpretation located in the Procedure tab within the Chart Review section. 

## 2015-06-10 ENCOUNTER — Telehealth: Payer: Self-pay | Admitting: Neurology

## 2015-06-10 DIAGNOSIS — G4733 Obstructive sleep apnea (adult) (pediatric): Secondary | ICD-10-CM

## 2015-06-10 NOTE — Telephone Encounter (Signed)
I spoke to patient and she is aware of results and would like to start treatment. Orders will be sent to [facility]. Report will be faxed to Dr. Einar Gip. I will send letter to patient reminding her to make appt and explain the importance of compliance.

## 2015-06-10 NOTE — Telephone Encounter (Signed)
Patient referred by Dr. Einar Gip, seen by me on 03/18/15, PSG on 05/03/15, CPAP titration on 06/07/15, Ins: [employer] Please call and inform patient that I have entered an order for treatment with positive airway pressure (PAP) treatment of obstructive sleep apnea (OSA). She did well during the latest sleep study with CPAP. We will, therefore, arrange for a machine for home use through a DME (durable medical equipment) company of Her choice; and I will see the patient back in follow-up in about 8-10 weeks. Please also explain to the patient that I will be looking out for compliance data, which can be downloaded from the machine (stored on an SD card, that is inserted in the machine) or via remote access through a modem, that is built into the machine. At the time of the followup appointment we will discuss sleep study results and how it is going with PAP treatment at home. Please advise patient to bring Her machine at the time of the first FU visit, even though this is cumbersome. Bringing the machine for every visit after that will likely not be needed, but often helps for the first visit to troubleshoot if needed. Please re-enforce the importance of compliance with treatment and the need for [place] to monitor compliance data - often an insurance requirement and actually good feedback for the patient as far as how they are doing.  Also remind patient, that any interim PAP machine or mask issues should be first addressed with the DME company, as they can often help better with technical and mask fit issues. Please ask if patient has a preference regarding DME company.  Please also make sure, the patient has a follow-up appointment with me in about 8-10 weeks from the setup date, thanks.  Once you have spoken to the patient - and faxed/routed report to PCP and referring MD (if other than PCP), you can close this encounter, thanks,   Star Age, MD, PhD [facility] ([place])

## 2015-08-04 ENCOUNTER — Ambulatory Visit: Payer: Self-pay

## 2015-08-04 ENCOUNTER — Ambulatory Visit (INDEPENDENT_AMBULATORY_CARE_PROVIDER_SITE_OTHER): Payer: 59 | Admitting: Podiatry

## 2015-08-04 DIAGNOSIS — M2040 Other hammer toe(s) (acquired), unspecified foot: Secondary | ICD-10-CM | POA: Diagnosis not present

## 2015-08-04 DIAGNOSIS — M79673 Pain in unspecified foot: Secondary | ICD-10-CM

## 2015-08-04 DIAGNOSIS — B351 Tinea unguium: Secondary | ICD-10-CM | POA: Diagnosis not present

## 2015-08-04 MED ORDER — TERBINAFINE HCL 250 MG PO TABS: ORAL_TABLET | ORAL | Status: DC

## 2015-08-04 NOTE — Progress Notes (Signed)
   Subjective:    Patient ID: Terri [name], female    DOB: 1971/07/24, 44 y.o.   MRN: [id number]  HPI Pt presents with bilateral nail thickening and discoloration   Review of Systems  Constitutional: Positive for diaphoresis, fatigue and unexpected weight change.  Endocrine: Positive for polydipsia and polyphagia.  Musculoskeletal: Positive for arthralgias.  All other systems reviewed and are negative.      Objective:   Physical Exam        Assessment & Plan:

## 2015-08-04 NOTE — Progress Notes (Signed)
Subjective:     Patient ID: Terri [name], female   DOB: 20-May-1971, 44 y.o.   MRN: [id number]  HPI patient has long-term diabetes with digital deformities of the fourth toes both feet and nail discoloration of the hallux second and third nails bilateral. Family history of this condition   Review of Systems  All other systems reviewed and are negative.      Objective:   Physical Exam  Constitutional: She is oriented to person, place, and time.  Cardiovascular: Intact distal pulses.   Musculoskeletal: Normal range of motion.  Neurological: She is oriented to person, place, and time.  Skin: Skin is warm.  Nursing note and vitals reviewed.  neurovascular status intact muscle strength adequate range of motion within normal limits with patient found to have discoloration of nailbeds and distal rotational issues of the fourth toe left over right with distal lateral keratotic lesions that are painful. She is found have good digital perfusion and is well oriented 3     Assessment:     Mycotic nail infection with hammertoe deformity bilateral    Plan:     H&P condition reviewed with patient. I have recommended Lamisil pulse therapy which will be initiated now and formula 3 topical and did discuss the digital deformities and consideration for distal arthroplasties if it becomes more symptomatic. Reappoint to recheck to evaluate

## 2016-10-01 DIAGNOSIS — G4733 Obstructive sleep apnea (adult) (pediatric): Secondary | ICD-10-CM | POA: Diagnosis not present

## 2016-10-10 DIAGNOSIS — J40 Bronchitis, not specified as acute or chronic: Secondary | ICD-10-CM | POA: Diagnosis not present

## 2016-10-10 DIAGNOSIS — E1165 Type 2 diabetes mellitus with hyperglycemia: Secondary | ICD-10-CM | POA: Diagnosis not present

## 2016-10-10 DIAGNOSIS — J329 Chronic sinusitis, unspecified: Secondary | ICD-10-CM | POA: Diagnosis not present

## 2016-10-24 DIAGNOSIS — G4733 Obstructive sleep apnea (adult) (pediatric): Secondary | ICD-10-CM | POA: Diagnosis not present

## 2016-11-23 DIAGNOSIS — E1165 Type 2 diabetes mellitus with hyperglycemia: Secondary | ICD-10-CM | POA: Diagnosis not present

## 2016-12-07 DIAGNOSIS — G4733 Obstructive sleep apnea (adult) (pediatric): Secondary | ICD-10-CM | POA: Diagnosis not present

## 2017-01-01 DIAGNOSIS — E1165 Type 2 diabetes mellitus with hyperglycemia: Secondary | ICD-10-CM | POA: Diagnosis not present

## 2017-01-01 DIAGNOSIS — Z0000 Encounter for general adult medical examination without abnormal findings: Secondary | ICD-10-CM | POA: Diagnosis not present

## 2017-01-08 DIAGNOSIS — Z0000 Encounter for general adult medical examination without abnormal findings: Secondary | ICD-10-CM | POA: Diagnosis not present

## 2017-01-08 DIAGNOSIS — E1121 Type 2 diabetes mellitus with diabetic nephropathy: Secondary | ICD-10-CM | POA: Diagnosis not present

## 2017-01-08 DIAGNOSIS — Z1389 Encounter for screening for other disorder: Secondary | ICD-10-CM | POA: Diagnosis not present

## 2017-01-08 DIAGNOSIS — G4733 Obstructive sleep apnea (adult) (pediatric): Secondary | ICD-10-CM | POA: Diagnosis not present

## 2017-01-08 DIAGNOSIS — R0789 Other chest pain: Secondary | ICD-10-CM | POA: Diagnosis not present

## 2017-01-08 DIAGNOSIS — G4734 Idiopathic sleep related nonobstructive alveolar hypoventilation: Secondary | ICD-10-CM | POA: Diagnosis not present

## 2017-01-22 DIAGNOSIS — G4733 Obstructive sleep apnea (adult) (pediatric): Secondary | ICD-10-CM | POA: Diagnosis not present

## 2017-02-01 DIAGNOSIS — Z01419 Encounter for gynecological examination (general) (routine) without abnormal findings: Secondary | ICD-10-CM | POA: Diagnosis not present

## 2017-02-01 DIAGNOSIS — N951 Menopausal and female climacteric states: Secondary | ICD-10-CM | POA: Diagnosis not present

## 2017-02-01 DIAGNOSIS — Z1231 Encounter for screening mammogram for malignant neoplasm of breast: Secondary | ICD-10-CM | POA: Diagnosis not present

## 2017-02-02 DIAGNOSIS — R8299 Other abnormal findings in urine: Secondary | ICD-10-CM | POA: Diagnosis not present

## 2017-02-22 DIAGNOSIS — E1165 Type 2 diabetes mellitus with hyperglycemia: Secondary | ICD-10-CM | POA: Diagnosis not present

## 2017-03-07 DIAGNOSIS — G4733 Obstructive sleep apnea (adult) (pediatric): Secondary | ICD-10-CM | POA: Diagnosis not present

## 2017-04-10 DIAGNOSIS — E1165 Type 2 diabetes mellitus with hyperglycemia: Secondary | ICD-10-CM | POA: Diagnosis not present

## 2017-04-10 DIAGNOSIS — E784 Other hyperlipidemia: Secondary | ICD-10-CM | POA: Diagnosis not present

## 2017-04-23 DIAGNOSIS — G4733 Obstructive sleep apnea (adult) (pediatric): Secondary | ICD-10-CM | POA: Diagnosis not present

## 2017-04-24 DIAGNOSIS — N951 Menopausal and female climacteric states: Secondary | ICD-10-CM | POA: Diagnosis not present

## 2017-06-05 DIAGNOSIS — G4733 Obstructive sleep apnea (adult) (pediatric): Secondary | ICD-10-CM | POA: Diagnosis not present

## 2017-06-07 DIAGNOSIS — M238X1 Other internal derangements of right knee: Secondary | ICD-10-CM | POA: Diagnosis not present

## 2017-07-10 DIAGNOSIS — G4733 Obstructive sleep apnea (adult) (pediatric): Secondary | ICD-10-CM | POA: Diagnosis not present

## 2017-07-23 DIAGNOSIS — G4733 Obstructive sleep apnea (adult) (pediatric): Secondary | ICD-10-CM | POA: Diagnosis not present

## 2017-08-13 ENCOUNTER — Encounter (HOSPITAL_COMMUNITY): Payer: Self-pay | Admitting: *Deleted

## 2017-08-14 ENCOUNTER — Ambulatory Visit: Payer: Self-pay | Admitting: Orthopedic Surgery

## 2017-08-14 NOTE — H&P (View-Only) (Signed)
Terri [name] DOB: [birth year] Married / Language: English / Race: White Female Date of Admission:  08/29/2017 CC:  Unstable right total knee and left knee pain History of Present Illness  The patient is a 46 year old female who comes in  for a preoperative History and Physical. The patient is scheduled for a right polyethylene revison versus revision right total knee and a left knee cortisone injection to be performed by Dr. Dione [name]. Aluisio, MD at [facility] on 08-29-2017. The patient is a 46 year old female who presented for follow up of their knee. The patient is being followed for their right knee pain and laxity. They are nearly 4 years out from the right total knee. Current treatment includes: NSAIDs and icing. She has also been using a knee brace that helps some. The following medication has been used for pain control: antiinflammatory medication (Aleve). She states there has not been any improvement in symptoms and would like to get the knee fixed at this time. She also has left knee pain and arthritis with changes and weather and constant pain on medial side. Terri [name] was last seen about 2 years ago and at that time we felt that she may need a polyethylene revision of that knee due to instability. She was doing great until she had a fall and developed instability after that. She is still having instability symptoms and gets swelling with it. She has pain with any kind of activity that involves uneven ground stairs or bending and squatting. The knee is affecting her activities and she is now ready to proceed with surgery. The left knee has also been flaring up and is requesting an injection at the time of surgery. They have been treated conservatively in the past for the above stated problem and despite conservative measures, they continue to have progressive pain and severe functional limitations and dysfunction. They have failed non-operative management including home exercise,  medications, and bracing. It is felt that they would benefit from undergoing revision of the total joint replacement. Risks and benefits of the procedure have been discussed with the patient and they elect to proceed with surgery. There are no active contraindications to surgery such as ongoing infection or rapidly progressive neurological disease.   Problem List/Past Medical  Osteoarthritis of left knee Knee joint laxity, right (M23.8X1)  Status post total right knee replacement (X21.194)  Anxiety Disorder  Hypercholesterolemia  Sleep Apnea  uses CPAP Insulin Dependent Diabetes Mellitus  Menopause    Allergies  No Known Drug Allergies  Family History  Cancer  grandmother mothers side and grandmother fathers side Cerebrovascular Accident  father and grandmother fathers side Diabetes Mellitus  Father. father Hypertension  mother, father and grandmother fathers side Heart Disease  grandmother fathers side  Social History  Current work status  working full time Exercise  Exercises rarely Children  3 Illicit drug use  no Alcohol use  current drinker; drinks wine; only occasionally per week Drug/Alcohol Rehab (Currently)  no Drug/Alcohol Rehab (Previously)  no Number of flights of stairs before winded  2-3 Marital status  Divorced. Pain Contract  no Tobacco use  Former smoker. former smoker; smoke(d) 1/2 pack(s) per day Tobacco / smoke exposure  no Living situation  Lives alone. Post-Surgical Plans  Home With Caregiver, Home, Straight to Outpatient Therapy. Advance Directives  None.  Medication History  Aleve (220MG  Tablet, Oral) Active. (as needed) Lisinopril (Oral) Specific strength unknown - Active. Synjardy (Oral) Specific strength unknown - Active. Sertraline  HCl (50MG  Tablet, Oral) Active. Atorvastatin Calcium (Oral) Specific strength unknown - Active. Soliqua (100-33UNT-MCG/ML Soln Pen-inj, Subcutaneous) Active. ClonazePAM (0.5MG   Tablet, Oral) Active.   Past Surgical History  Arthroscopy of Knee  bilateral Cesarean Delivery  Date: 1998. 1 time Total Knee Replacement - Right  Date: 2014.  Review of Systems General Present- Fatigue and Night Sweats. Not Present- Chills, Fever, Memory Loss, Weight Gain and Weight Loss. Skin Not Present- Eczema, Hives, Itching, Lesions and Rash. HEENT Not Present- Dentures, Double Vision, Headache, Hearing Loss, Tinnitus and Visual Loss. Respiratory Not Present- Allergies, Chronic Cough, Coughing up blood, Shortness of breath at rest and Shortness of breath with exertion. Cardiovascular Not Present- Chest Pain, Difficulty Breathing Lying Down, Murmur, Palpitations, Racing/skipping heartbeats and Swelling. Gastrointestinal Not Present- Abdominal Pain, Bloody Stool, Constipation, Diarrhea, Difficulty Swallowing, Heartburn, Jaundice, Loss of appetitie, Nausea and Vomiting. Female Genitourinary Present- Urinary frequency and Urinating at Night. Not Present- Blood in Urine, Discharge, Flank Pain, Incontinence, Painful Urination, Urgency, Urinary Retention and Weak urinary stream. Musculoskeletal Present- Joint Pain, Morning Stiffness and Muscle Weakness. Not Present- Back Pain, Muscle Pain and Spasms. Neurological Not Present- Blackout spells, Difficulty with balance, Dizziness, Paralysis, Tremor and Weakness. Psychiatric Not Present- Insomnia.  Vitals Weight: 210 lb Height: 67in Weight was reported by patient. Height was reported by patient. Body Surface Area: 2.06 m Body Mass Index: 32.89 kg/m  Pulse: 84 (Regular)  BP: 112/64 (Sitting, Right Arm, Standard)   Physical Exam  General Mental Status -Alert, cooperative and good historian. General Appearance-pleasant, Not in acute distress. Orientation-Oriented X3. Build & Nutrition-Well nourished and Well developed.  Head and Neck Head-normocephalic, atraumatic . Neck Global Assessment - supple, no bruit  auscultated on the right, no bruit auscultated on the left.  Eye Pupil - Bilateral-Regular and Round. Motion - Bilateral-EOMI.  Chest and Lung Exam Auscultation Breath sounds - clear at anterior chest wall and clear at posterior chest wall. Adventitious sounds - No Adventitious sounds.  Cardiovascular Auscultation Rhythm - Regular rate and rhythm. Heart Sounds - S1 WNL and S2 WNL. Murmurs & Other Heart Sounds - Auscultation of the heart reveals - No Murmurs.  Abdomen Palpation/Percussion Tenderness - Abdomen is non-tender to palpation. Rigidity (guarding) - Abdomen is soft. Auscultation Auscultation of the abdomen reveals - Bowel sounds normal.  Female Genitourinary Note: Not done, not pertinent to present illness   Musculoskeletal Note: Examination of the right hip shows flexion to 120 rotation in 30 abduction 40 and external rotation of 40. There is no tenderness over the greater trochanter. There is no pain on provocative testing of the hip. Her RIGHT knee shows trace effusion. There is no warmth about the knee. Range is 0-130. She does have pretty significant varus valgus laxity in extension and AP laxity in flexion. The left knee shows no effusion. Her range about 5 to 125. She is tender medially. There is no lateral tenderness or instability.  Radiographs AP and lateral of the RIGHT knee show her prosthesis to be in excellent position with no periprosthetic abnormalities.  Assessment & Plan Status post total right knee replacement (Z61.096) Knee joint laxity, right (M23.8X1) Osteoarthritis of left knee (M17.12)  Note:Surgical Plans: Right Knee Polyethylene Revision versus a Right Total Knee Revision and a Left Knee Cortisone Inection at time of surgery.  Disposition: Home with help, Straight to Outpatient  PCP: Dr Ardeth Perfect  IV TXA  Anesthesia Issues: None  Patient was instructed on what medications to stop prior to surgery.  Signed electronically by  Alexzandrew Monika Salk, III PA-C

## 2017-08-14 NOTE — H&P (Signed)
Terri [name] DOB: [birth year] Married / Language: English / Race: White Female Date of Admission:  08/29/2017 CC:  Unstable right total knee and left knee pain History of Present Illness  The patient is a 46 year old female who comes in  for a preoperative History and Physical. The patient is scheduled for a right polyethylene revison versus revision right total knee and a left knee cortisone injection to be performed by Dr. Dione Plover. Aluisio, MD at [facility] on 08-29-2017. The patient is a 46 year old female who presented for follow up of their knee. The patient is being followed for their right knee pain and laxity. They are nearly 4 years out from the right total knee. Current treatment includes: NSAIDs and icing. She has also been using a knee brace that helps some. The following medication has been used for pain control: antiinflammatory medication (Aleve). She states there has not been any improvement in symptoms and would like to get the knee fixed at this time. She also has left knee pain and arthritis with changes and weather and constant pain on medial side. Terri [name] was last seen about 2 years ago and at that time we felt that she may need a polyethylene revision of that knee due to instability. She was doing great until she had a fall and developed instability after that. She is still having instability symptoms and gets swelling with it. She has pain with any kind of activity that involves uneven ground stairs or bending and squatting. The knee is affecting her activities and she is now ready to proceed with surgery. The left knee has also been flaring up and is requesting an injection at the time of surgery. They have been treated conservatively in the past for the above stated problem and despite conservative measures, they continue to have progressive pain and severe functional limitations and dysfunction. They have failed non-operative management including home exercise,  medications, and bracing. It is felt that they would benefit from undergoing revision of the total joint replacement. Risks and benefits of the procedure have been discussed with the patient and they elect to proceed with surgery. There are no active contraindications to surgery such as ongoing infection or rapidly progressive neurological disease.   Problem List/Past Medical  Osteoarthritis of left knee Knee joint laxity, right (M23.8X1)  Status post total right knee replacement (Z12.458)  Anxiety Disorder  Hypercholesterolemia  Sleep Apnea  uses CPAP Insulin Dependent Diabetes Mellitus  Menopause    Allergies  No Known Drug Allergies  Family History  Cancer  grandmother mothers side and grandmother fathers side Cerebrovascular Accident  father and grandmother fathers side Diabetes Mellitus  Father. father Hypertension  mother, father and grandmother fathers side Heart Disease  grandmother fathers side  Social History  Current work status  working full time Exercise  Exercises rarely Children  3 Illicit drug use  no Alcohol use  current drinker; drinks wine; only occasionally per week Drug/Alcohol Rehab (Currently)  no Drug/Alcohol Rehab (Previously)  no Number of flights of stairs before winded  2-3 Marital status  Divorced. Pain Contract  no Tobacco use  Former smoker. former smoker; smoke(d) 1/2 pack(s) per day Tobacco / smoke exposure  no Living situation  Lives alone. Post-Surgical Plans  Home With Caregiver, Home, Straight to Outpatient Therapy. Advance Directives  None.  Medication History  Aleve (220MG  Tablet, Oral) Active. (as needed) Lisinopril (Oral) Specific strength unknown - Active. Synjardy (Oral) Specific strength unknown - Active. Sertraline  HCl (50MG  Tablet, Oral) Active. Atorvastatin Calcium (Oral) Specific strength unknown - Active. Soliqua (100-33UNT-MCG/ML Soln Pen-inj, Subcutaneous) Active. ClonazePAM (0.5MG   Tablet, Oral) Active.   Past Surgical History  Arthroscopy of Knee  bilateral Cesarean Delivery  Date: 1998. 1 time Total Knee Replacement - Right  Date: 2014.  Review of Systems General Present- Fatigue and Night Sweats. Not Present- Chills, Fever, Memory Loss, Weight Gain and Weight Loss. Skin Not Present- Eczema, Hives, Itching, Lesions and Rash. HEENT Not Present- Dentures, Double Vision, Headache, Hearing Loss, Tinnitus and Visual Loss. Respiratory Not Present- Allergies, Chronic Cough, Coughing up blood, Shortness of breath at rest and Shortness of breath with exertion. Cardiovascular Not Present- Chest Pain, Difficulty Breathing Lying Down, Murmur, Palpitations, Racing/skipping heartbeats and Swelling. Gastrointestinal Not Present- Abdominal Pain, Bloody Stool, Constipation, Diarrhea, Difficulty Swallowing, Heartburn, Jaundice, Loss of appetitie, Nausea and Vomiting. Female Genitourinary Present- Urinary frequency and Urinating at Night. Not Present- Blood in Urine, Discharge, Flank Pain, Incontinence, Painful Urination, Urgency, Urinary Retention and Weak urinary stream. Musculoskeletal Present- Joint Pain, Morning Stiffness and Muscle Weakness. Not Present- Back Pain, Muscle Pain and Spasms. Neurological Not Present- Blackout spells, Difficulty with balance, Dizziness, Paralysis, Tremor and Weakness. Psychiatric Not Present- Insomnia.  Vitals Weight: 210 lb Height: 67in Weight was reported by patient. Height was reported by patient. Body Surface Area: 2.06 m Body Mass Index: 32.89 kg/m  Pulse: 84 (Regular)  BP: 112/64 (Sitting, Right Arm, Standard)   Physical Exam  General Mental Status -Alert, cooperative and good historian. General Appearance-pleasant, Not in acute distress. Orientation-Oriented X3. Build & Nutrition-Well nourished and Well developed.  Head and Neck Head-normocephalic, atraumatic . Neck Global Assessment - supple, no bruit  auscultated on the right, no bruit auscultated on the left.  Eye Pupil - Bilateral-Regular and Round. Motion - Bilateral-EOMI.  Chest and Lung Exam Auscultation Breath sounds - clear at anterior chest wall and clear at posterior chest wall. Adventitious sounds - No Adventitious sounds.  Cardiovascular Auscultation Rhythm - Regular rate and rhythm. Heart Sounds - S1 WNL and S2 WNL. Murmurs & Other Heart Sounds - Auscultation of the heart reveals - No Murmurs.  Abdomen Palpation/Percussion Tenderness - Abdomen is non-tender to palpation. Rigidity (guarding) - Abdomen is soft. Auscultation Auscultation of the abdomen reveals - Bowel sounds normal.  Female Genitourinary Note: Not done, not pertinent to present illness   Musculoskeletal Note: Examination of the right hip shows flexion to 120 rotation in 30 abduction 40 and external rotation of 40. There is no tenderness over the greater trochanter. There is no pain on provocative testing of the hip. Her RIGHT knee shows trace effusion. There is no warmth about the knee. Range is 0-130. She does have pretty significant varus valgus laxity in extension and AP laxity in flexion. The left knee shows no effusion. Her range about 5 to 125. She is tender medially. There is no lateral tenderness or instability.  Radiographs AP and lateral of the RIGHT knee show her prosthesis to be in excellent position with no periprosthetic abnormalities.  Assessment & Plan Status post total right knee replacement (N39.767) Knee joint laxity, right (M23.8X1) Osteoarthritis of left knee (M17.12)  Note:Surgical Plans: Right Knee Polyethylene Revision versus a Right Total Knee Revision and a Left Knee Cortisone Inection at time of surgery.  Disposition: Home with help, Straight to Outpatient  PCP: Dr Ardeth Perfect  IV TXA  Anesthesia Issues: None  Patient was instructed on what medications to stop prior to surgery.  Signed electronically by  Chanson Teems Monika Salk, III PA-C

## 2017-08-24 ENCOUNTER — Other Ambulatory Visit (HOSPITAL_COMMUNITY): Payer: Self-pay | Admitting: *Deleted

## 2017-08-24 NOTE — Patient Instructions (Signed)
Kenleigh Toback  [id number]   Your procedure is scheduled on: 08-29-17  Report to [facility] Main  Entrance  Report to admitting at 1115 AM   Call this number if you have problems the morning of surgery [phone]   Remember: ONLY 1 PERSON MAY GO WITH YOU TO SHORT STAY TO GET  READY MORNING OF YOUR SURGERY.  Do not eat food :After Midnight.CLEAR LIQUIDS FROM MIDNIGHT UNTIL 715 AM DAY OF SURGERY-NOTHING BY MOUTH AFTER 715 AM DAY OF SURGERY.  How to Manage Your Diabetes Before and After Surgery  Why is it important to control my blood sugar before and after surgery? . Improving blood sugar levels before and after surgery helps healing and can limit problems. . A way of improving blood sugar control is eating a healthy diet by: o  Eating less sugar and carbohydrates o  Increasing activity/exercise o  Talking with your doctor about reaching your blood sugar goals . High blood sugars (greater than 180 mg/dL) can raise your risk of infections and slow your recovery, so you will need to focus on controlling your diabetes during the weeks before surgery. . Make sure that the doctor who takes care of your diabetes knows about your planned surgery including the date and location.  How do I manage my blood sugar before surgery? . Check your blood sugar at least 4 times a day, starting 2 days before surgery, to make sure that the level is not too high or low. o Check your blood sugar the morning of your surgery when you wake up and every 2 hours until you get to the Short Stay unit. . If your blood sugar is less than 70 mg/dL, you will need to treat for low blood sugar: o Do not take insulin. o Treat a low blood sugar (less than 70 mg/dL) with  cup of clear juice (cranberry or apple), 4 glucose tablets, OR glucose gel. o Recheck blood sugar in 15 minutes after treatment (to make sure it is greater than 70 mg/dL). If your blood sugar is not greater than 70 mg/dL on recheck,  call [phone] for further instructions. . Report your blood sugar to the short stay nurse when you get to Short Stay.  . If you are admitted to the hospital after surgery: o Your blood sugar will be checked by the staff and you will probably be given insulin after surgery (instead of oral diabetes medicines) to make sure you have good blood sugar levels. o The goal for blood sugar control after surgery is 80-180 mg/dL.   WHAT DO I DO ABOUT MY DIABETES MEDICATION?  Marland Kitchen Do not take oral diabetes medicines (pills) the morning of surgery.  Marland Kitchen TAKE TOUR SOLIQUA AS USUAL THE DAY BEFORE SURGERY, TAKE 1/2 DOSE OF YOUR SOLIQUA THE MORNING OF YOUR SURGERY 12-12-1.8      . TAKE YOUR SYNJARDE AS USUAL THE DAY BEFORE YOUR SURGERY, DO NOT TAKE YOUR SYNJARDE THE DAY OF YOUR SURGERY12-12-18.  . The day of surgery, do not take other diabetes injectables, including Byetta (exenatide), Bydureon (exenatide ER), Victoza (liraglutide), or Trulicity (dulaglutide).    Take these medicines the morning of surgery with A SIP OF WATER:  CLONAZEPAM (KLONOPIN)                               You may not  have any metal on your body including hair pins and              piercings  Do not wear jewelry, make-up, lotions, powders or perfumes, deodorant             Do not wear nail polish.  Do not shave  48 hours prior to surgery.              Men may shave face and neck.   Do not bring valuables to the hospital. [place].  Contacts, dentures or bridgework may not be worn into surgery.  Leave suitcase in the car. After surgery it may be brought to your room.     Patients discharged the day of surgery will not be allowed to drive home.  Name and phone number of your driver:  Special Instructions: N/A              Please read over the following fact sheets you were given: _____________________________________________________________________                CLEAR  LIQUID DIET   Foods Allowed                                                                     Foods Excluded  Coffee and tea, regular and decaf                             liquids that you cannot  Plain Jell-O in any flavor                                             see through such as: Fruit ices (not with fruit pulp)                                     milk, soups, orange juice  Iced Popsicles                                    All solid food Carbonated beverages, regular and diet                                    Cranberry, grape and apple juices Sports drinks like Gatorade Lightly seasoned clear broth or consume(fat free) Sugar, honey syrup  Sample Menu Breakfast                                Lunch                                     Supper Cranberry juice  Beef broth                            Chicken broth Jell-O                                     Grape juice                           Apple juice Coffee or tea                        Jell-O                                      Popsicle                                                Coffee or tea                        Coffee or tea  _____________________________________________________________________    Incentive Spirometer  An incentive spirometer is a tool that can help keep your lungs clear and active. This tool measures how well you are filling your lungs with each breath. Taking long deep breaths may help reverse or decrease the chance of developing breathing (pulmonary) problems (especially infection) following:  A long period of time when you are unable to move or be active. BEFORE THE PROCEDURE   If the spirometer includes an indicator to show your best effort, your nurse or respiratory therapist will set it to a desired goal.  If possible, sit up straight or lean slightly forward. Try not to slouch.  Hold the incentive spirometer in an upright position. INSTRUCTIONS FOR USE  1. Sit on the  edge of your bed if possible, or sit up as far as you can in bed or on a chair. 2. Hold the incentive spirometer in an upright position. 3. Breathe out normally. 4. Place the mouthpiece in your mouth and seal your lips tightly around it. 5. Breathe in slowly and as deeply as possible, raising the piston or the ball toward the top of the column. 6. Hold your breath for 3-5 seconds or for as long as possible. Allow the piston or ball to fall to the bottom of the column. 7. Remove the mouthpiece from your mouth and breathe out normally. 8. Rest for a few seconds and repeat Steps 1 through 7 at least 10 times every 1-2 hours when you are awake. Take your time and take a few normal breaths between deep breaths. 9. The spirometer may include an indicator to show your best effort. Use the indicator as a goal to work toward during each repetition. 10. After each set of 10 deep breaths, practice coughing to be sure your lungs are clear. If you have an incision (the cut made at the time of surgery), support your incision when coughing by placing a pillow or rolled up towels firmly against it. Once you are able to get out of bed, walk around indoors and cough well. You may stop using the incentive  spirometer when instructed by your caregiver.  RISKS AND COMPLICATIONS  Take your time so you do not get dizzy or light-headed.  If you are in pain, you may need to take or ask for pain medication before doing incentive spirometry. It is harder to take a deep breath if you are having pain. AFTER USE  Rest and breathe slowly and easily.  It can be helpful to keep track of a log of your progress. Your caregiver can provide you with a simple table to help with this. If you are using the spirometer at home, follow these instructions: [place] IF:   You are having difficultly using the spirometer.  You have trouble using the spirometer as often as instructed.  Your pain medication is not giving enough  relief while using the spirometer.  You develop fever of 100.5 F (38.1 C) or higher. SEEK IMMEDIATE MEDICAL CARE IF:   You cough up bloody sputum that had not been present before.  You develop fever of 102 F (38.9 C) or greater.  You develop worsening pain at or near the incision site. MAKE SURE YOU:   Understand these instructions.  Will watch your condition.  Will get help right away if you are not doing well or get worse. Document Released: 01/15/2007 Document Revised: 11/27/2011 Document Reviewed: 03/18/2007 Exit[employer] Patient Information 2014 Exit[employer], [place].   ________________________________________________________________________  WHAT IS A BLOOD TRANSFUSION? Blood Transfusion Information  A transfusion is the replacement of blood or some of its parts. Blood is made up of multiple cells which provide different functions.  Red blood cells carry oxygen and are used for blood loss replacement.  White blood cells fight against infection.  Platelets control bleeding.  Plasma helps clot blood.  Other blood products are available for specialized needs, such as hemophilia or other clotting disorders. BEFORE THE TRANSFUSION  Who gives blood for transfusions?   Healthy volunteers who are fully evaluated to make sure their blood is safe. This is blood bank blood. Transfusion therapy is the safest it has ever been in the practice of medicine. Before blood is taken from a donor, a complete history is taken to make sure that person has no history of diseases nor engages in risky social behavior (examples are intravenous drug use or sexual activity with multiple partners). The donor's travel history is screened to minimize risk of transmitting infections, such as malaria. The donated blood is tested for signs of infectious diseases, such as HIV and hepatitis. The blood is then tested to be sure it is compatible with you in order to minimize the chance of a transfusion reaction. If  you or a relative donates blood, this is often done in anticipation of surgery and is not appropriate for emergency situations. It takes many days to process the donated blood. RISKS AND COMPLICATIONS Although transfusion therapy is very safe and saves many lives, the main dangers of transfusion include:   Getting an infectious disease.  Developing a transfusion reaction. This is an allergic reaction to something in the blood you were given. Every precaution is taken to prevent this. The decision to have a blood transfusion has been considered carefully by your caregiver before blood is given. Blood is not given unless the benefits outweigh the risks. AFTER THE TRANSFUSION  Right after receiving a blood transfusion, you will usually feel much better and more energetic. This is especially true if your red blood cells have gotten low (anemic). The transfusion raises the level of the red  blood cells which carry oxygen, and this usually causes an energy increase.  The nurse administering the transfusion will monitor you carefully for complications. HOME CARE INSTRUCTIONS  No special instructions are needed after a transfusion. You may find your energy is better. Speak with your caregiver about any limitations on activity for underlying diseases you may have. SEEK MEDICAL CARE IF:   Your condition is not improving after your transfusion.  You develop redness or irritation at the intravenous (IV) site. SEEK IMMEDIATE MEDICAL CARE IF:  Any of the following symptoms occur over the next 12 hours:  Shaking chills.  You have a temperature by mouth above 102 F (38.9 C), not controlled by medicine.  Chest, back, or muscle pain.  People around you feel you are not acting correctly or are confused.  Shortness of breath or difficulty breathing.  Dizziness and fainting.  You get a rash or develop hives.  You have a decrease in urine output.  Your urine turns a dark color or changes to pink,  red, or brown. Any of the following symptoms occur over the next 10 days:  You have a temperature by mouth above 102 F (38.9 C), not controlled by medicine.  Shortness of breath.  Weakness after normal activity.  The white part of the eye turns yellow (jaundice).  You have a decrease in the amount of urine or are urinating less often.  Your urine turns a dark color or changes to pink, red, or brown. Document Released: 09/01/2000 Document Revised: 11/27/2011 Document Reviewed: 04/20/2008 [facility] Patient Information 2014 [place], [place].  _______________________________________________________________________

## 2017-08-24 NOTE — Progress Notes (Signed)
LOV DR Jackelyn Poling 08-15-17 ON CHART

## 2017-08-26 ENCOUNTER — Ambulatory Visit: Payer: Self-pay | Admitting: Orthopedic Surgery

## 2017-08-27 ENCOUNTER — Other Ambulatory Visit: Payer: Self-pay

## 2017-08-27 ENCOUNTER — Encounter (HOSPITAL_COMMUNITY): Payer: Self-pay

## 2017-08-27 ENCOUNTER — Encounter (HOSPITAL_COMMUNITY)
Admission: RE | Admit: 2017-08-27 | Discharge: 2017-08-27 | Disposition: A | Discharge status: Home / Self Care | Payer: 59 | Source: Ambulatory Visit | Attending: Orthopedic Surgery | Admitting: Orthopedic Surgery

## 2017-08-27 DIAGNOSIS — Z7982 Long term (current) use of aspirin: Secondary | ICD-10-CM | POA: Diagnosis not present

## 2017-08-27 DIAGNOSIS — F410 Panic disorder [episodic paroxysmal anxiety] without agoraphobia: Secondary | ICD-10-CM | POA: Diagnosis not present

## 2017-08-27 DIAGNOSIS — Y792 Prosthetic and other implants, materials and accessory orthopedic devices associated with adverse incidents: Secondary | ICD-10-CM | POA: Diagnosis not present

## 2017-08-27 DIAGNOSIS — E785 Hyperlipidemia, unspecified: Secondary | ICD-10-CM | POA: Diagnosis not present

## 2017-08-27 DIAGNOSIS — E119 Type 2 diabetes mellitus without complications: Secondary | ICD-10-CM | POA: Diagnosis not present

## 2017-08-27 DIAGNOSIS — Z794 Long term (current) use of insulin: Secondary | ICD-10-CM | POA: Diagnosis not present

## 2017-08-27 DIAGNOSIS — Z79899 Other long term (current) drug therapy: Secondary | ICD-10-CM | POA: Diagnosis not present

## 2017-08-27 DIAGNOSIS — G4730 Sleep apnea, unspecified: Secondary | ICD-10-CM | POA: Diagnosis not present

## 2017-08-27 DIAGNOSIS — Z87891 Personal history of nicotine dependence: Secondary | ICD-10-CM | POA: Diagnosis not present

## 2017-08-27 DIAGNOSIS — M1712 Unilateral primary osteoarthritis, left knee: Secondary | ICD-10-CM | POA: Diagnosis not present

## 2017-08-27 DIAGNOSIS — Z85828 Personal history of other malignant neoplasm of skin: Secondary | ICD-10-CM | POA: Diagnosis not present

## 2017-08-27 DIAGNOSIS — T84022A Instability of internal right knee prosthesis, initial encounter: Secondary | ICD-10-CM | POA: Diagnosis not present

## 2017-08-27 DIAGNOSIS — I10 Essential (primary) hypertension: Secondary | ICD-10-CM | POA: Diagnosis not present

## 2017-08-27 HISTORY — DX: Other specified postprocedural states: Z98.890

## 2017-08-27 HISTORY — DX: Nausea with vomiting, unspecified: R11.2

## 2017-08-27 HISTORY — DX: Essential (primary) hypertension: I10

## 2017-08-27 HISTORY — DX: Sleep apnea, unspecified: G47.30

## 2017-08-27 HISTORY — DX: Malignant (primary) neoplasm, unspecified: C80.1

## 2017-08-27 LAB — COMPREHENSIVE METABOLIC PANEL
ALT: 29 U/L (ref 14–54)
ANION GAP: 7 (ref 5–15)
AST: 27 U/L (ref 15–41)
Albumin: 4.2 g/dL (ref 3.5–5.0)
Alkaline Phosphatase: 119 U/L (ref 38–126)
BILIRUBIN TOTAL: 0.6 mg/dL (ref 0.3–1.2)
BUN: 11 mg/dL (ref 6–20)
CO2: 29 mmol/L (ref 22–32)
Calcium: 9.6 mg/dL (ref 8.9–10.3)
Chloride: 103 mmol/L (ref 101–111)
Creatinine, Ser: 0.41 mg/dL — ABNORMAL LOW (ref 0.44–1.00)
GFR calc Af Amer: >60 mL/min (ref >60)
GFR calc non Af Amer: >60 mL/min (ref >60)
Glucose, Bld: 102 mg/dL — ABNORMAL HIGH (ref 65–99)
POTASSIUM: 4.3 mmol/L (ref 3.5–5.1)
Sodium: 139 mmol/L (ref 135–145)
TOTAL PROTEIN: 7.2 g/dL (ref 6.5–8.1)

## 2017-08-27 LAB — PROTIME-INR
INR: 0.92
PROTHROMBIN TIME: 12.2 s (ref 11.4–15.2)

## 2017-08-27 LAB — SURGICAL PCR SCREEN
MRSA, PCR: NEGATIVE
STAPHYLOCOCCUS AUREUS: NEGATIVE

## 2017-08-27 LAB — CBC
HEMATOCRIT: 42.8 % (ref 36.0–46.0)
Hemoglobin: 13.6 g/dL (ref 12.0–15.0)
MCH: 27.5 pg (ref 26.0–34.0)
MCHC: 31.8 g/dL (ref 30.0–36.0)
MCV: 86.5 fL (ref 78.0–100.0)
Platelets: 212 10*3/uL (ref 150–400)
RBC: 4.95 MIL/uL (ref 3.87–5.11)
RDW: 13.9 % (ref 11.5–15.5)
WBC: 7.4 10*3/uL (ref 4.0–10.5)

## 2017-08-27 LAB — HEMOGLOBIN A1C
Hgb A1c MFr Bld: 7.8 % — ABNORMAL HIGH (ref 4.8–5.6)
MEAN PLASMA GLUCOSE: 177.16 mg/dL

## 2017-08-27 LAB — GLUCOSE, CAPILLARY: GLUCOSE-CAPILLARY: 124 mg/dL — AB (ref 65–99)

## 2017-08-27 LAB — APTT: aPTT: 28 seconds (ref 24–36)

## 2017-08-27 NOTE — Progress Notes (Signed)
Per DM coordinator treat Soliqua like Lantus per DM guidliness

## 2017-08-28 NOTE — Progress Notes (Signed)
Hgba1c routed to Dr. Wynelle Link via epic

## 2017-08-29 ENCOUNTER — Encounter (HOSPITAL_COMMUNITY): Payer: Self-pay | Admitting: *Deleted

## 2017-08-29 ENCOUNTER — Inpatient Hospital Stay (HOSPITAL_COMMUNITY): Payer: 59 | Admitting: Anesthesiology

## 2017-08-29 ENCOUNTER — Observation Stay (HOSPITAL_COMMUNITY)
Admission: RE | Admit: 2017-08-29 | Discharge: 2017-08-30 | Disposition: A | Discharge status: Home / Self Care | Payer: 59 | Source: Ambulatory Visit | Attending: Orthopedic Surgery | Admitting: Orthopedic Surgery

## 2017-08-29 ENCOUNTER — Encounter (HOSPITAL_COMMUNITY)
Admission: RE | Disposition: A | Discharge status: Home / Self Care | Payer: Self-pay | Source: Ambulatory Visit | Attending: Orthopedic Surgery

## 2017-08-29 ENCOUNTER — Other Ambulatory Visit: Payer: Self-pay

## 2017-08-29 DIAGNOSIS — T84018A Broken internal joint prosthesis, other site, initial encounter: Secondary | ICD-10-CM

## 2017-08-29 DIAGNOSIS — F410 Panic disorder [episodic paroxysmal anxiety] without agoraphobia: Secondary | ICD-10-CM | POA: Insufficient documentation

## 2017-08-29 DIAGNOSIS — T84022A Instability of internal right knee prosthesis, initial encounter: Secondary | ICD-10-CM | POA: Diagnosis not present

## 2017-08-29 DIAGNOSIS — Z87891 Personal history of nicotine dependence: Secondary | ICD-10-CM | POA: Insufficient documentation

## 2017-08-29 DIAGNOSIS — Z85828 Personal history of other malignant neoplasm of skin: Secondary | ICD-10-CM | POA: Insufficient documentation

## 2017-08-29 DIAGNOSIS — M1712 Unilateral primary osteoarthritis, left knee: Secondary | ICD-10-CM | POA: Diagnosis not present

## 2017-08-29 DIAGNOSIS — G4730 Sleep apnea, unspecified: Secondary | ICD-10-CM | POA: Insufficient documentation

## 2017-08-29 DIAGNOSIS — Z79899 Other long term (current) drug therapy: Secondary | ICD-10-CM | POA: Insufficient documentation

## 2017-08-29 DIAGNOSIS — Y792 Prosthetic and other implants, materials and accessory orthopedic devices associated with adverse incidents: Secondary | ICD-10-CM | POA: Insufficient documentation

## 2017-08-29 DIAGNOSIS — Z96651 Presence of right artificial knee joint: Secondary | ICD-10-CM | POA: Diagnosis not present

## 2017-08-29 DIAGNOSIS — I10 Essential (primary) hypertension: Secondary | ICD-10-CM | POA: Diagnosis not present

## 2017-08-29 DIAGNOSIS — E119 Type 2 diabetes mellitus without complications: Secondary | ICD-10-CM | POA: Diagnosis not present

## 2017-08-29 DIAGNOSIS — E785 Hyperlipidemia, unspecified: Secondary | ICD-10-CM | POA: Insufficient documentation

## 2017-08-29 DIAGNOSIS — Z7982 Long term (current) use of aspirin: Secondary | ICD-10-CM | POA: Insufficient documentation

## 2017-08-29 DIAGNOSIS — Z794 Long term (current) use of insulin: Secondary | ICD-10-CM | POA: Insufficient documentation

## 2017-08-29 DIAGNOSIS — Z96659 Presence of unspecified artificial knee joint: Secondary | ICD-10-CM

## 2017-08-29 HISTORY — PX: TOTAL KNEE ARTHROPLASTY WITH REVISION COMPONENTS: SHX6198

## 2017-08-29 LAB — TYPE AND SCREEN
ABO/RH(D): A POS
ANTIBODY SCREEN: NEGATIVE

## 2017-08-29 LAB — GLUCOSE, CAPILLARY
GLUCOSE-CAPILLARY: 118 mg/dL — AB (ref 65–99)
GLUCOSE-CAPILLARY: 85 mg/dL (ref 65–99)
GLUCOSE-CAPILLARY: 142 mg/dL — AB (ref 65–99)
Glucose-Capillary: 229 mg/dL — ABNORMAL HIGH (ref 65–99)

## 2017-08-29 SURGERY — TOTAL KNEE ARTHROPLASTY WITH REVISION COMPONENTS
Anesthesia: Spinal | Site: Knee | Laterality: Right

## 2017-08-29 MED ORDER — TRANEXAMIC ACID 1000 MG/10ML IV SOLN
1000.0000 mg | INTRAVENOUS | Status: AC
  Administered 2017-08-29: 1000 mg via INTRAVENOUS
  Filled 2017-08-29: qty 1100

## 2017-08-29 MED ORDER — OXYCODONE HCL 5 MG PO TABS
5.0000 mg | ORAL_TABLET | ORAL | Status: DC | PRN
Start: 2017-08-29 — End: 2017-08-30
  Administered 2017-08-29 – 2017-08-30 (×2): 5 mg via ORAL
  Filled 2017-08-29 (×2): qty 1

## 2017-08-29 MED ORDER — ADENOSINE 6 MG/2ML IV SOLN
INTRAVENOUS | Status: AC
  Administered 2017-08-29: 6 mg via INTRAVENOUS
  Filled 2017-08-29: qty 4

## 2017-08-29 MED ORDER — METOCLOPRAMIDE HCL 5 MG PO TABS: 5.0000 mg | ORAL_TABLET | Freq: Three times a day (TID) | ORAL | Status: DC | PRN

## 2017-08-29 MED ORDER — GABAPENTIN 300 MG PO CAPS
300.0000 mg | ORAL_CAPSULE | Freq: Once | ORAL | Status: AC
  Administered 2017-08-29: 300 mg via ORAL

## 2017-08-29 MED ORDER — FENTANYL CITRATE (PF) 100 MCG/2ML IJ SOLN
50.0000 ug | INTRAMUSCULAR | Status: DC
  Administered 2017-08-29: 50 ug via INTRAVENOUS

## 2017-08-29 MED ORDER — LACTATED RINGERS IV SOLN
INTRAVENOUS | Status: DC
  Administered 2017-08-29 (×2): via INTRAVENOUS

## 2017-08-29 MED ORDER — SODIUM CHLORIDE 0.9 % IR SOLN
Status: DC | PRN
  Administered 2017-08-29: 1000 mL

## 2017-08-29 MED ORDER — ACETAMINOPHEN 10 MG/ML IV SOLN
INTRAVENOUS | Status: AC
  Filled 2017-08-29: qty 100

## 2017-08-29 MED ORDER — PHENYLEPHRINE HCL 10 MG/ML IJ SOLN
INTRAVENOUS | Status: DC | PRN
  Administered 2017-08-29: 25 ug/min via INTRAVENOUS

## 2017-08-29 MED ORDER — CEFAZOLIN SODIUM-DEXTROSE 2-4 GM/100ML-% IV SOLN
2.0000 g | Freq: Four times a day (QID) | INTRAVENOUS | Status: AC
  Administered 2017-08-29 – 2017-08-30 (×2): 2 g via INTRAVENOUS
  Filled 2017-08-29: qty 100

## 2017-08-29 MED ORDER — DEXTROSE 5 % IV SOLN
500.0000 mg | Freq: Four times a day (QID) | INTRAVENOUS | Status: DC | PRN
  Filled 2017-08-29: qty 5

## 2017-08-29 MED ORDER — SODIUM CHLORIDE 0.9 % IJ SOLN
INTRAMUSCULAR | Status: AC
  Filled 2017-08-29: qty 10

## 2017-08-29 MED ORDER — KETOROLAC TROMETHAMINE 30 MG/ML IJ SOLN: 30.0000 mg | Freq: Once | INTRAMUSCULAR | Status: DC | PRN

## 2017-08-29 MED ORDER — DIPHENHYDRAMINE HCL 12.5 MG/5ML PO ELIX: 12.5000 mg | ORAL_SOLUTION | ORAL | Status: DC | PRN

## 2017-08-29 MED ORDER — CLONAZEPAM 1 MG PO TABS: 1.0000 mg | ORAL_TABLET | Freq: Three times a day (TID) | ORAL | Status: DC | PRN

## 2017-08-29 MED ORDER — METHYLPREDNISOLONE ACETATE 80 MG/ML IJ SUSP
INTRAMUSCULAR | Status: DC | PRN
  Administered 2017-08-29: 80 mg via INTRA_ARTICULAR

## 2017-08-29 MED ORDER — MENTHOL 3 MG MT LOZG: 1.0000 | LOZENGE | OROMUCOSAL | Status: DC | PRN

## 2017-08-29 MED ORDER — MIDAZOLAM HCL 2 MG/2ML IJ SOLN
INTRAMUSCULAR | Status: AC
  Filled 2017-08-29: qty 2

## 2017-08-29 MED ORDER — PHENOL 1.4 % MT LIQD: 1.0000 | OROMUCOSAL | Status: DC | PRN

## 2017-08-29 MED ORDER — METOCLOPRAMIDE HCL 5 MG/ML IJ SOLN: 5.0000 mg | Freq: Three times a day (TID) | INTRAMUSCULAR | Status: DC | PRN

## 2017-08-29 MED ORDER — ESMOLOL HCL 100 MG/10ML IV SOLN
INTRAVENOUS | Status: AC
  Filled 2017-08-29: qty 10

## 2017-08-29 MED ORDER — DOCUSATE SODIUM 100 MG PO CAPS
100.0000 mg | ORAL_CAPSULE | Freq: Two times a day (BID) | ORAL | Status: DC
  Administered 2017-08-29 – 2017-08-30 (×2): 100 mg via ORAL
  Filled 2017-08-29 (×2): qty 1

## 2017-08-29 MED ORDER — PHENYLEPHRINE HCL 10 MG/ML IJ SOLN
INTRAMUSCULAR | Status: AC
  Filled 2017-08-29: qty 1

## 2017-08-29 MED ORDER — CHLORHEXIDINE GLUCONATE 4 % EX LIQD: 60.0000 mL | Freq: Once | CUTANEOUS | Status: DC

## 2017-08-29 MED ORDER — METHOCARBAMOL 500 MG PO TABS
500.0000 mg | ORAL_TABLET | Freq: Four times a day (QID) | ORAL | Status: DC | PRN
  Administered 2017-08-29 – 2017-08-30 (×2): 500 mg via ORAL
  Filled 2017-08-29 (×2): qty 1

## 2017-08-29 MED ORDER — ONDANSETRON HCL 4 MG PO TABS: 4.0000 mg | ORAL_TABLET | Freq: Four times a day (QID) | ORAL | Status: DC | PRN

## 2017-08-29 MED ORDER — MIDAZOLAM HCL 5 MG/5ML IJ SOLN: INTRAMUSCULAR | Status: DC | PRN

## 2017-08-29 MED ORDER — ESMOLOL HCL-SODIUM CHLORIDE 2000 MG/100ML IV SOLN
25.0000 ug/kg/min | INTRAVENOUS | Status: DC
  Filled 2017-08-29: qty 100

## 2017-08-29 MED ORDER — ACETAMINOPHEN 500 MG PO TABS
1000.0000 mg | ORAL_TABLET | Freq: Four times a day (QID) | ORAL | Status: AC
  Administered 2017-08-29 – 2017-08-30 (×4): 1000 mg via ORAL
  Filled 2017-08-29 (×4): qty 2

## 2017-08-29 MED ORDER — GABAPENTIN 300 MG PO CAPS
ORAL_CAPSULE | ORAL | Status: AC
  Administered 2017-08-29: 300 mg via ORAL
  Filled 2017-08-29: qty 1

## 2017-08-29 MED ORDER — PROPOFOL 10 MG/ML IV BOLUS
INTRAVENOUS | Status: AC
  Filled 2017-08-29: qty 40

## 2017-08-29 MED ORDER — POLYETHYLENE GLYCOL 3350 17 G PO PACK: 17.0000 g | PACK | Freq: Every day | ORAL | Status: DC | PRN

## 2017-08-29 MED ORDER — ONDANSETRON HCL 4 MG/2ML IJ SOLN: 4.0000 mg | Freq: Four times a day (QID) | INTRAMUSCULAR | Status: DC | PRN

## 2017-08-29 MED ORDER — SERTRALINE HCL 100 MG PO TABS
100.0000 mg | ORAL_TABLET | Freq: Every day | ORAL | Status: DC
  Administered 2017-08-29: 100 mg via ORAL
  Filled 2017-08-29: qty 1

## 2017-08-29 MED ORDER — ONDANSETRON HCL 4 MG/2ML IJ SOLN
INTRAMUSCULAR | Status: DC | PRN
  Administered 2017-08-29: 4 mg via INTRAVENOUS

## 2017-08-29 MED ORDER — INSULIN GLARGINE-LIXISENATIDE 100-33 UNT-MCG/ML ~~LOC~~ SOPN
20.0000 [IU] | PEN_INJECTOR | SUBCUTANEOUS | Status: DC
  Administered 2017-08-30: 20 [IU] via SUBCUTANEOUS

## 2017-08-29 MED ORDER — RIVAROXABAN 10 MG PO TABS
10.0000 mg | ORAL_TABLET | Freq: Every day | ORAL | Status: DC
  Administered 2017-08-30: 10 mg via ORAL
  Filled 2017-08-29: qty 1

## 2017-08-29 MED ORDER — BUPIVACAINE LIPOSOME 1.3 % IJ SUSP
20.0000 mL | Freq: Once | INTRAMUSCULAR | Status: DC
  Filled 2017-08-29: qty 20

## 2017-08-29 MED ORDER — ACETAMINOPHEN 10 MG/ML IV SOLN
1000.0000 mg | Freq: Once | INTRAVENOUS | Status: AC
  Administered 2017-08-29: 1000 mg via INTRAVENOUS

## 2017-08-29 MED ORDER — MEPERIDINE HCL 50 MG/ML IJ SOLN: 6.2500 mg | INTRAMUSCULAR | Status: DC | PRN

## 2017-08-29 MED ORDER — MIDAZOLAM HCL 5 MG/5ML IJ SOLN
INTRAMUSCULAR | Status: DC | PRN
  Administered 2017-08-29: 2 mg via INTRAVENOUS

## 2017-08-29 MED ORDER — ONDANSETRON HCL 4 MG/2ML IJ SOLN
INTRAMUSCULAR | Status: AC
  Filled 2017-08-29: qty 2

## 2017-08-29 MED ORDER — SODIUM CHLORIDE 0.9 % IV SOLN
INTRAVENOUS | Status: DC
  Administered 2017-08-29: 16:00:00 via INTRAVENOUS

## 2017-08-29 MED ORDER — CEFAZOLIN SODIUM-DEXTROSE 2-4 GM/100ML-% IV SOLN
INTRAVENOUS | Status: AC
  Filled 2017-08-29: qty 100

## 2017-08-29 MED ORDER — SODIUM CHLORIDE 0.9 % IJ SOLN
INTRAMUSCULAR | Status: AC
  Filled 2017-08-29: qty 50

## 2017-08-29 MED ORDER — INSULIN ASPART 100 UNIT/ML ~~LOC~~ SOLN
0.0000 [IU] | Freq: Three times a day (TID) | SUBCUTANEOUS | Status: DC
  Administered 2017-08-30: 3 [IU] via SUBCUTANEOUS

## 2017-08-29 MED ORDER — HYDROMORPHONE HCL 1 MG/ML IJ SOLN: 0.2500 mg | INTRAMUSCULAR | Status: DC | PRN

## 2017-08-29 MED ORDER — PROPOFOL 500 MG/50ML IV EMUL
INTRAVENOUS | Status: DC | PRN
  Administered 2017-08-29: 50 ug/kg/min via INTRAVENOUS

## 2017-08-29 MED ORDER — MIDAZOLAM HCL 2 MG/2ML IJ SOLN
1.0000 mg | INTRAMUSCULAR | Status: DC
  Administered 2017-08-29: 1 mg via INTRAVENOUS

## 2017-08-29 MED ORDER — BUPIVACAINE LIPOSOME 1.3 % IJ SUSP
INTRAMUSCULAR | Status: DC | PRN
  Administered 2017-08-29: 20 mL

## 2017-08-29 MED ORDER — MIDAZOLAM HCL 2 MG/2ML IJ SOLN
INTRAMUSCULAR | Status: AC
  Administered 2017-08-29: 1 mg via INTRAVENOUS
  Filled 2017-08-29: qty 2

## 2017-08-29 MED ORDER — DEXAMETHASONE SODIUM PHOSPHATE 10 MG/ML IJ SOLN
10.0000 mg | Freq: Once | INTRAMUSCULAR | Status: AC
  Administered 2017-08-29: 10 mg via INTRAVENOUS

## 2017-08-29 MED ORDER — ACETAMINOPHEN 650 MG RE SUPP: 650.0000 mg | RECTAL | Status: DC | PRN

## 2017-08-29 MED ORDER — ACETAMINOPHEN 325 MG PO TABS: 650.0000 mg | ORAL_TABLET | ORAL | Status: DC | PRN

## 2017-08-29 MED ORDER — OXYCODONE HCL 5 MG PO TABS
10.0000 mg | ORAL_TABLET | ORAL | Status: DC | PRN
  Administered 2017-08-30 (×2): 10 mg via ORAL
  Filled 2017-08-29 (×2): qty 2

## 2017-08-29 MED ORDER — FLEET ENEMA 7-19 GM/118ML RE ENEM: 1.0000 | ENEMA | Freq: Once | RECTAL | Status: DC | PRN

## 2017-08-29 MED ORDER — ADENOSINE 6 MG/2ML IV SOLN
6.0000 mg | Freq: Once | INTRAVENOUS | Status: AC
  Administered 2017-08-29: 6 mg via INTRAVENOUS
  Filled 2017-08-29: qty 2

## 2017-08-29 MED ORDER — CEFAZOLIN SODIUM-DEXTROSE 2-4 GM/100ML-% IV SOLN
2.0000 g | INTRAVENOUS | Status: AC
  Administered 2017-08-29: 2 g via INTRAVENOUS

## 2017-08-29 MED ORDER — METHYLPREDNISOLONE ACETATE 40 MG/ML IJ SUSP
INTRAMUSCULAR | Status: AC
  Filled 2017-08-29: qty 2

## 2017-08-29 MED ORDER — FENTANYL CITRATE (PF) 100 MCG/2ML IJ SOLN
INTRAMUSCULAR | Status: AC
  Administered 2017-08-29: 50 ug via INTRAVENOUS
  Filled 2017-08-29: qty 2

## 2017-08-29 MED ORDER — NALTREXONE-BUPROPION HCL ER 8-90 MG PO TB12: 2.0000 | ORAL_TABLET | Freq: Two times a day (BID) | ORAL | Status: DC

## 2017-08-29 MED ORDER — ESMOLOL BOLUS VIA INFUSION
10000.0000 ug | Freq: Once | INTRAVENOUS | Status: DC
  Administered 2017-08-29: 10000 ug via INTRAVENOUS

## 2017-08-29 MED ORDER — DEXAMETHASONE SODIUM PHOSPHATE 10 MG/ML IJ SOLN
10.0000 mg | Freq: Once | INTRAMUSCULAR | Status: AC
  Administered 2017-08-30: 10:00:00 10 mg via INTRAVENOUS
  Filled 2017-08-29: qty 1

## 2017-08-29 MED ORDER — BISACODYL 10 MG RE SUPP: 10.0000 mg | Freq: Every day | RECTAL | Status: DC | PRN

## 2017-08-29 MED ORDER — SODIUM CHLORIDE 0.9 % IJ SOLN
INTRAMUSCULAR | Status: DC | PRN
  Administered 2017-08-29: 60 mL

## 2017-08-29 MED ORDER — PROMETHAZINE HCL 25 MG/ML IJ SOLN: 6.2500 mg | INTRAMUSCULAR | Status: DC | PRN

## 2017-08-29 SURGICAL SUPPLY — 38 items
ATTUNE PSRP INSR SZ6 10 KNEE (Insert) ×2 IMPLANT
BAG ZIPLOCK 12X15 (MISCELLANEOUS) IMPLANT
BANDAGE ACE 6X5 VEL STRL LF (GAUZE/BANDAGES/DRESSINGS) ×2 IMPLANT
BLADE SAG 18X100X1.27 (BLADE) ×2 IMPLANT
COVER SURGICAL LIGHT HANDLE (MISCELLANEOUS) ×2 IMPLANT
CUFF TOURN SGL QUICK 34 (TOURNIQUET CUFF) ×1
CUFF TRNQT CYL 34X4X40X1 (TOURNIQUET CUFF) ×1 IMPLANT
DRAPE U-SHAPE 47X51 STRL (DRAPES) ×2 IMPLANT
DRSG ADAPTIC 3X8 NADH LF (GAUZE/BANDAGES/DRESSINGS) ×2 IMPLANT
DRSG PAD ABDOMINAL 8X10 ST (GAUZE/BANDAGES/DRESSINGS) ×2 IMPLANT
DURAPREP 26ML APPLICATOR (WOUND CARE) ×2 IMPLANT
ELECT REM PT RETURN 15FT ADLT (MISCELLANEOUS) ×2 IMPLANT
EVACUATOR 1/8 PVC DRAIN (DRAIN) ×2 IMPLANT
GAUZE SPONGE 4X4 12PLY STRL (GAUZE/BANDAGES/DRESSINGS) ×2 IMPLANT
GLOVE BIO SURGEON STRL SZ7.5 (GLOVE) ×4 IMPLANT
GLOVE BIO SURGEON STRL SZ8 (GLOVE) ×2 IMPLANT
GLOVE BIOGEL PI IND STRL 8 (GLOVE) ×1 IMPLANT
GLOVE BIOGEL PI INDICATOR 8 (GLOVE) ×1
GLOVE SURG SS PI 6.5 STRL IVOR (GLOVE) IMPLANT
GOWN STRL REUS W/TWL LRG LVL3 (GOWN DISPOSABLE) ×6 IMPLANT
GOWN STRL REUS W/TWL XL LVL3 (GOWN DISPOSABLE) ×2 IMPLANT
HANDPIECE INTERPULSE COAX TIP (DISPOSABLE) ×1
IMMOBILIZER KNEE 20 (SOFTGOODS) ×2
IMMOBILIZER KNEE 20 THIGH 36 (SOFTGOODS) ×1 IMPLANT
MANIFOLD NEPTUNE II (INSTRUMENTS) ×2 IMPLANT
NS IRRIG 1000ML POUR BTL (IV SOLUTION) ×2 IMPLANT
PACK TOTAL KNEE CUSTOM (KITS) ×2 IMPLANT
PADDING CAST COTTON 6X4 STRL (CAST SUPPLIES) ×2 IMPLANT
POSITIONER SURGICAL ARM (MISCELLANEOUS) ×2 IMPLANT
SET HNDPC FAN SPRY TIP SCT (DISPOSABLE) ×1 IMPLANT
STRIP CLOSURE SKIN 1/2X4 (GAUZE/BANDAGES/DRESSINGS) ×2 IMPLANT
SUT MNCRL AB 4-0 PS2 18 (SUTURE) ×2 IMPLANT
SUT STRATAFIX 0 PDS 27 VIOLET (SUTURE) ×2
SUT VIC AB 2-0 CT1 27 (SUTURE) ×3
SUT VIC AB 2-0 CT1 TAPERPNT 27 (SUTURE) ×3 IMPLANT
SUTURE STRATFX 0 PDS 27 VIOLET (SUTURE) ×1 IMPLANT
WATER STERILE IRR 1000ML POUR (IV SOLUTION) ×2 IMPLANT
WRAP KNEE MAXI GEL POST OP (GAUZE/BANDAGES/DRESSINGS) ×2 IMPLANT

## 2017-08-29 NOTE — Anesthesia Procedure Notes (Signed)
Date/Time: 08/29/2017 1:10 PM Performed by: Glory Buff, CRNA Oxygen Delivery Method: Simple face mask

## 2017-08-29 NOTE — Brief Op Note (Signed)
08/29/2017  0:31 PM  PATIENT:  Terri [name]  46 y.o. female  PRE-OPERATIVE DIAGNOSIS:  Unstable right total knee arthroplasty      Osteoarthritis left knee  POST-OPERATIVE DIAGNOSIS: Unstable right total knee arthroplasty      Osteoarthritis left knee PROCEDURE:  RIGHT KNEE TIBIAL POLYETHYLENE REVISION   LEFT KNEE CORTISONE INJECTION  SURGEON:  Surgeon(s) and Role:    Gaynelle Arabian, MD - Primary  PHYSICIAN ASSISTANT:   ASSISTANTS: Arlee Muslim, PA-C   ANESTHESIA:   Adductor canal block and spinal  EBL:  25 ml  BLOOD ADMINISTERED:none  DRAINS: (medium) Hemovact drain(s) in the right knee with  Suction Open   LOCAL MEDICATIONS USED:  OTHER exparel  COUNTS:  YES  TOURNIQUET:  \26 minutes at 300 mm HG  DICTATION: .Other Dictation: Dictation Number [id number]  PLAN OF CARE: Admit to inpatient   PATIENT DISPOSITION:  PACU - hemodynamically stable.

## 2017-08-29 NOTE — Transfer of Care (Signed)
Immediate Anesthesia Transfer of Care Note  Patient: Terri [name]  Procedure(s) Performed: Right knee polyethylene versus total knee arthroplasty revision injection left knee (Right Knee)  Patient Location: PACU  Anesthesia Type:MAC and Spinal  Level of Consciousness: awake, alert  and oriented  Airway & Oxygen Therapy: Patient Spontanous Breathing and Patient connected to face mask oxygen  Post-op Assessment: Report given to RN and Post -op Vital signs reviewed and stable  Post vital signs: Reviewed and stable  Last Vitals:  Vitals:   08/29/17 1245 08/29/17 1250  BP: 123/66 126/72  Pulse: 92 91  Resp: 12 (!) 24  Temp:    SpO2: 99% 96%    Last Pain:  Vitals:   08/29/17 1142  TempSrc:   PainSc: 0-No pain         Complications: No apparent anesthesia complications

## 2017-08-29 NOTE — Anesthesia Postprocedure Evaluation (Signed)
Anesthesia Post Note  Patient: Rashika Bettes  Procedure(s) Performed: Right knee polyethylene versus total knee arthroplasty revision injection left knee (Right Knee)     Patient location during evaluation: PACU Anesthesia Type: Spinal Level of consciousness: awake Pain management: pain level controlled Vital Signs Assessment: post-procedure vital signs reviewed and stable Respiratory status: spontaneous breathing Cardiovascular status: stable Postop Assessment: no headache, no backache, spinal receding, no apparent nausea or vomiting and patient able to bend at knees Anesthetic complications: no    Last Vitals:  Vitals:   08/29/17 1500 08/29/17 1515  BP: 112/77 112/76  Pulse: 88 88  Resp: 14 18  Temp:    SpO2: 97% 98%    Last Pain:  Vitals:   08/29/17 1445  TempSrc:   PainSc: 0-No pain   Pain Goal:                 Lanett Lasorsa JR,JOHN Sabriah Hobbins

## 2017-08-29 NOTE — Anesthesia Preprocedure Evaluation (Addendum)
Anesthesia Evaluation  Patient identified by MRN, date of birth, ID band Patient awake    Reviewed: Allergy & Precautions, NPO status , Patient's Chart, lab work & pertinent test results  History of Anesthesia Complications (+) PONV  Airway Mallampati: I       Dental no notable dental hx. (+) Teeth Intact   Pulmonary former smoker,    Pulmonary exam normal breath sounds clear to auscultation       Cardiovascular hypertension, Normal cardiovascular exam Rhythm:Regular Rate:Normal     Neuro/Psych    GI/Hepatic   Endo/Other  diabetes  Renal/GU      Musculoskeletal   Abdominal (+) + obese,   Peds  Hematology   Anesthesia Other Findings   Reproductive/Obstetrics                             Anesthesia Physical Anesthesia Plan  ASA: II  Anesthesia Plan: Spinal   Post-op Pain Management:  Regional for Post-op pain   Induction:   PONV Risk Score and Plan: Ondansetron and Dexamethasone  Airway Management Planned: Natural Airway, Nasal Cannula and Simple Face Mask  Additional Equipment:   Intra-op Plan:   Post-operative Plan:   Informed Consent: I have reviewed the patients History and Physical, chart, labs and discussed the procedure including the risks, benefits and alternatives for the proposed anesthesia with the patient or authorized representative who has indicated his/her understanding and acceptance.     Plan Discussed with: CRNA and Surgeon  Anesthesia Plan Comments:         Anesthesia Quick Evaluation

## 2017-08-29 NOTE — Anesthesia Procedure Notes (Addendum)
Anesthesia Regional Block: Adductor canal block   Pre-Anesthetic Checklist: ,, timeout performed, Correct Patient, Correct Site, Correct Laterality, Correct Procedure, Correct Position, site marked, Risks and benefits discussed,  Surgical consent,  Pre-op evaluation,  At surgeon's request and post-op pain management  Laterality: Right and Lower  Prep: chloraprep       Needles:  Injection technique: Single-shot     Needle Length: 9cm  Needle Gauge: 21     Additional Needles:   Procedures:,,,, ultrasound used (permanent image in chart),,,,  Narrative:  Start time: 08/29/2017 12:05 PM End time: 08/29/2017 12:15 PM Injection made incrementally with aspirations every 5 mL.  Performed by: Personally  Anesthesiologist: Lyn Hollingshead, MD

## 2017-08-29 NOTE — Discharge Instructions (Addendum)
° °Dr. Frank Aluisio °Total Joint Specialist °[facility] °[address] °[place], [place] [address] °[phone] ° °TOTAL KNEE REPLACEMENT POSTOPERATIVE DIRECTIONS ° °Knee Rehabilitation, Guidelines Following Surgery  °Results after knee surgery are often greatly improved when you follow the exercise, range of motion and muscle strengthening exercises prescribed by your doctor. Safety measures are also important to protect the knee from further injury. Any time any of these exercises cause you to have increased pain or swelling in your knee joint, decrease the amount until you are comfortable again and slowly increase them. If you have problems or questions, call your caregiver or physical therapist for advice.  ° °HOME CARE INSTRUCTIONS  °Remove items at home which could result in a fall. This includes throw rugs or furniture in walking pathways.  °· ICE to the affected knee every three hours for 30 minutes at a time and then as needed for pain and swelling.  Continue to use ice on the knee for pain and swelling from surgery. You may notice swelling that will progress down to the foot and ankle.  This is normal after surgery.  Elevate the leg when you are not up walking on it.   °· Continue to use the breathing machine which will help keep your temperature down.  It is common for your temperature to cycle up and down following surgery, especially at night when you are not up moving around and exerting yourself.  The breathing machine keeps your lungs expanded and your temperature down. °· Do not place pillow under knee, focus on keeping the knee straight while resting ° °DIET °You may resume your previous home diet once your are discharged from the hospital. ° °DRESSING / WOUND CARE / SHOWERING °You may shower 3 days after surgery, but keep the wounds dry during showering.  You may use an occlusive plastic wrap (Press'n Seal for example), NO SOAKING/SUBMERGING IN THE BATHTUB.  If the  bandage gets wet, change with a clean dry gauze.  If the incision gets wet, pat the wound dry with a clean towel. °You may start showering once you are discharged home but do not submerge the incision under water. Just pat the incision dry and apply a dry gauze dressing on daily. °Change the surgical dressing daily and reapply a dry dressing each time. ° °ACTIVITY °Walk with your walker as instructed. °Use walker as long as suggested by your caregivers. °Avoid periods of inactivity such as sitting longer than an hour when not asleep. This helps prevent blood clots.  °You may resume a sexual relationship in one month or when given the OK by your doctor.  °You may return to work once you are cleared by your doctor.  °Do not drive a car for 6 weeks or until released by you surgeon.  °Do not drive while taking narcotics. ° °WEIGHT BEARING °Weight bearing as tolerated with assist device (walker, cane, etc) as directed, use it as long as suggested by your surgeon or therapist, typically at least 4-6 weeks. ° °POSTOPERATIVE CONSTIPATION PROTOCOL °Constipation - defined medically as fewer than three stools per week and severe constipation as less than one stool per week. ° °One of the most common issues patients have following surgery is constipation.  Even if you have a regular bowel pattern at home, your normal regimen is likely to be disrupted due to multiple reasons following surgery.  Combination of anesthesia, postoperative narcotics, change in appetite and fluid intake all can affect your bowels.    In order to avoid complications following surgery, here are some recommendations in order to help you during your recovery period. ° °Colace (docusate) - Pick up an over-the-counter form of Colace or another stool softener and take twice a day as long as you are requiring postoperative pain medications.  Take with a full glass of water daily.  If you experience loose stools or diarrhea, hold the colace until you stool forms  back up.  If your symptoms do not get better within 1 week or if they get worse, check with your doctor. ° °Dulcolax (bisacodyl) - Pick up over-the-counter and take as directed by the product packaging as needed to assist with the movement of your bowels.  Take with a full glass of water.  Use this product as needed if not relieved by Colace only.  ° °MiraLax (polyethylene glycol) - Pick up over-the-counter to have on hand.  MiraLax is a solution that will increase the amount of water in your bowels to assist with bowel movements.  Take as directed and can mix with a glass of water, juice, soda, coffee, or tea.  Take if you go more than two days without a movement. °Do not use MiraLax more than once per day. Call your doctor if you are still constipated or irregular after using this medication for 7 days in a row. ° °If you continue to have problems with postoperative constipation, please contact the office for further assistance and recommendations.  If you experience "the worst abdominal pain ever" or develop nausea or vomiting, please contact the office immediatly for further recommendations for treatment. ° °ITCHING ° If you experience itching with your medications, try taking only a single pain pill, or even half a pain pill at a time.  You can also use Benadryl over the counter for itching or also to help with sleep.  ° °TED HOSE STOCKINGS °Wear the elastic stockings on both legs for three weeks following surgery during the day but you may remove then at night for sleeping. ° °MEDICATIONS °See your medication summary on the “After Visit Summary” that the nursing staff will review with you prior to discharge.  You may have some home medications which will be placed on hold until you complete the course of blood thinner medication.  It is important for you to complete the blood thinner medication as prescribed by your surgeon.  Continue your approved medications as instructed at time of  discharge. ° °PRECAUTIONS °If you experience chest pain or shortness of breath - call 911 immediately for transfer to the hospital emergency department.  °If you develop a fever greater that 101 F, purulent drainage from wound, increased redness or drainage from wound, foul odor from the wound/dressing, or calf pain - CONTACT YOUR SURGEON.   °                                                °FOLLOW-UP APPOINTMENTS °Make sure you keep all of your appointments after your operation with your surgeon and caregivers. You should call the office at the above phone number and make an appointment for approximately two weeks after the date of your surgery or on the date instructed by your surgeon outlined in the "After Visit Summary". ° ° °RANGE OF MOTION AND STRENGTHENING EXERCISES  °Rehabilitation of the knee is important following a knee injury or   an operation. After just a few days of immobilization, the muscles of the thigh which control the knee become weakened and shrink (atrophy). Knee exercises are designed to build up the tone and strength of the thigh muscles and to improve knee motion. Often times heat used for twenty to thirty minutes before working out will loosen up your tissues and help with improving the range of motion but do not use heat for the first two weeks following surgery. These exercises can be done on a training (exercise) mat, on the floor, on a table or on a bed. Use what ever works the best and is most comfortable for you Knee exercises include:  °Leg Lifts - While your knee is still immobilized in a splint or cast, you can do straight leg raises. Lift the leg to 60 degrees, hold for 3 sec, and slowly lower the leg. Repeat 10-20 times 2-3 times daily. Perform this exercise against resistance later as your knee gets better.  °Quad and Hamstring Sets - Tighten up the muscle on the front of the thigh (Quad) and hold for 5-10 sec. Repeat this 10-20 times hourly. Hamstring sets are done by pushing the  foot backward against an object and holding for 5-10 sec. Repeat as with quad sets.  °· Leg Slides: Lying on your back, slowly slide your foot toward your buttocks, bending your knee up off the floor (only go as far as is comfortable). Then slowly slide your foot back down until your leg is flat on the floor again. °· Angel Wings: Lying on your back spread your legs to the side as far apart as you can without causing discomfort.  °A rehabilitation program following serious knee injuries can speed recovery and prevent re-injury in the future due to weakened muscles. Contact your doctor or a physical therapist for more information on knee rehabilitation.  ° °IF YOU ARE TRANSFERRED TO A SKILLED REHAB FACILITY °If the patient is transferred to a skilled rehab facility following release from the hospital, a list of the current medications will be sent to the facility for the patient to continue.  When discharged from the skilled rehab facility, please have the facility set up the patient's Home Health Physical Therapy prior to being released. Also, the skilled facility will be responsible for providing the patient with their medications at time of release from the facility to include their pain medication, the muscle relaxants, and their blood thinner medication. If the patient is still at the rehab facility at time of the two week follow up appointment, the skilled rehab facility will also need to assist the patient in arranging follow up appointment in our office and any transportation needs. ° °MAKE SURE YOU:  °Understand these instructions.  °Get help right away if you are not doing well or get worse.  ° ° °Pick up stool softner and laxative for home use following surgery while on pain medications. °Do not submerge incision under water. °Please use good hand washing techniques while changing dressing each day. °May shower starting three days after surgery. °Please use a clean towel to pat the incision dry following  showers. °Continue to use ice for pain and swelling after surgery. °Do not use any lotions or creams on the incision until instructed by your surgeon. ° °Take Xarelto for two and a half more weeks following discharge from the hospital, then discontinue Xarelto. °Once the patient has completed the Xarelto, they may resume the 81 mg Aspirin. ° ° ° °Information on   my medicine - XARELTO (Rivaroxaban)  This medication education was reviewed with me or my healthcare representative as part of my discharge preparation.  Why was Xarelto prescribed for you? Xarelto was prescribed for you to reduce the risk of blood clots forming after orthopedic surgery. The medical term for these abnormal blood clots is venous thromboembolism (VTE).  What do you need to know about xarelto ? Take your Xarelto ONCE DAILY at the same time every day. You may take it either with or without food.  If you have difficulty swallowing the tablet whole, you may crush it and mix in applesauce just prior to taking your dose.  Take Xarelto exactly as prescribed by your doctor and DO NOT stop taking Xarelto without talking to the doctor who prescribed the medication.  Stopping without other VTE prevention medication to take the place of Xarelto may increase your risk of developing a clot.  After discharge, you should have regular check-up appointments with your healthcare provider that is prescribing your Xarelto.    What do you do if you miss a dose? If you miss a dose, take it as soon as you remember on the same day then continue your regularly scheduled once daily regimen the next day. Do not take two doses of Xarelto on the same day.   Important Safety Information A possible side effect of Xarelto is bleeding. You should call your healthcare provider right away if you experience any of the following: ? Bleeding from an injury or your nose that does not stop. ? Unusual colored urine (red or dark brown) or unusual colored  stools (red or black). ? Unusual bruising for unknown reasons. ? A serious fall or if you hit your head (even if there is no bleeding).  Some medicines may interact with Xarelto and might increase your risk of bleeding while on Xarelto. To help avoid this, consult your healthcare provider or pharmacist prior to using any new prescription or non-prescription medications, including herbals, vitamins, non-steroidal anti-inflammatory drugs (NSAIDs) and supplements.  This website has more information on Xarelto: [email or web address]

## 2017-08-29 NOTE — Progress Notes (Addendum)
AssistedDr. Jillyn Hidden with right, ultrasound guided, adductor canal block. Side rails up, monitors on throughout procedure. See vital signs in flow sheet. Tolerated Procedure well.  Block finished at 1220, Pt with sudden onset SVT at 1227. Denies CP, SOB, N/V,diaphoresis.  A+ O, reports can feel heart racing otherwise no s/s. Valsalva and carotid massage( by MD)  without success.  Esmolol given by Dr Jillyn Hidden- without change.  Adenosine 6mg  given by Dr Jillyn Hidden with conversion to NSR.  Pt reports has had feelings of heart racing in the past, worked up by Dr Einar Gip and nothing found at that time. Told Panic attacks.   Went to OR

## 2017-08-29 NOTE — Anesthesia Procedure Notes (Signed)
Spinal  Patient location during procedure: OR Start time: 08/29/2017 1:00 PM End time: 08/29/2017 1:04 PM Staffing Anesthesiologist: Lyn Hollingshead, MD Performed: anesthesiologist  Preanesthetic Checklist Completed: patient identified, surgical consent, pre-op evaluation, timeout performed, IV checked, risks and benefits discussed and monitors and equipment checked Spinal Block Patient position: sitting Prep: ChloraPrep and site prepped and draped Patient monitoring: continuous pulse ox and blood pressure Location: L3-4 Injection technique: single-shot Needle Needle type: Pencan  Needle length: 10 cm Needle insertion depth: 6 cm Assessment Sensory level: T8

## 2017-08-29 NOTE — Interval H&P Note (Signed)
History and Physical Interval Note:  61/95/0932 67:12 AM  Terri [name]  has presented today for surgery, with the diagnosis of Unstable right total knee arthroplasty   The various methods of treatment have been discussed with the patient and family. After consideration of risks, benefits and other options for treatment, the patient has consented to  Procedure(s): Right knee polyethylene versus total knee arthroplasty revision (Right) as a surgical intervention .  The patient's history has been reviewed, patient examined, no change in status, stable for surgery.  I have reviewed the patient's chart and labs.  Questions were answered to the patient's satisfaction.     Pilar Plate Dwayn Moravek

## 2017-08-30 ENCOUNTER — Encounter (HOSPITAL_COMMUNITY): Payer: Self-pay | Admitting: Orthopedic Surgery

## 2017-08-30 DIAGNOSIS — M1712 Unilateral primary osteoarthritis, left knee: Secondary | ICD-10-CM | POA: Diagnosis not present

## 2017-08-30 LAB — BASIC METABOLIC PANEL
Anion gap: 6 (ref 5–15)
BUN: 11 mg/dL (ref 6–20)
CHLORIDE: 108 mmol/L (ref 101–111)
CO2: 26 mmol/L (ref 22–32)
CREATININE: 0.37 mg/dL — AB (ref 0.44–1.00)
Calcium: 8.8 mg/dL — ABNORMAL LOW (ref 8.9–10.3)
GFR calc Af Amer: >60 mL/min (ref >60)
GFR calc non Af Amer: >60 mL/min (ref >60)
GLUCOSE: 145 mg/dL — AB (ref 65–99)
POTASSIUM: 4.2 mmol/L (ref 3.5–5.1)
Sodium: 140 mmol/L (ref 135–145)

## 2017-08-30 LAB — CBC
HEMATOCRIT: 38.7 % (ref 36.0–46.0)
HEMOGLOBIN: 12.6 g/dL (ref 12.0–15.0)
MCH: 27.8 pg (ref 26.0–34.0)
MCHC: 32.6 g/dL (ref 30.0–36.0)
MCV: 85.2 fL (ref 78.0–100.0)
Platelets: 207 10*3/uL (ref 150–400)
RBC: 4.54 MIL/uL (ref 3.87–5.11)
RDW: 13.6 % (ref 11.5–15.5)
WBC: 10.4 10*3/uL (ref 4.0–10.5)

## 2017-08-30 LAB — GLUCOSE, CAPILLARY
GLUCOSE-CAPILLARY: 119 mg/dL — AB (ref 65–99)
Glucose-Capillary: 157 mg/dL — ABNORMAL HIGH (ref 65–99)

## 2017-08-30 MED ORDER — METHOCARBAMOL 500 MG PO TABS: 500.0000 mg | ORAL_TABLET | Freq: Four times a day (QID) | ORAL | 0 refills | Status: DC | PRN

## 2017-08-30 MED ORDER — RIVAROXABAN 10 MG PO TABS: 10.0000 mg | ORAL_TABLET | Freq: Every day | ORAL | 0 refills | Status: DC

## 2017-08-30 MED ORDER — OXYCODONE HCL 5 MG PO TABS: 5.0000 mg | ORAL_TABLET | ORAL | 0 refills | Status: DC | PRN

## 2017-08-30 NOTE — Progress Notes (Signed)
Discharge planning, no HH needs identified. Plan for OP PT. [phone]

## 2017-08-30 NOTE — Op Note (Signed)
NAMEGRACIANNA, VINK NO.:  [id number]  MEDICAL RECORD NO.:  [id number]  LOCATION:                                 FACILITY:  PHYSICIAN:  Gaynelle Arabian, M.D.         DATE OF BIRTH:  DATE OF PROCEDURE:  08/29/2017 DATE OF DISCHARGE:                              OPERATIVE REPORT   PREOPERATIVE DIAGNOSES: 1. Unstable right total knee arthroplasty. 2. Osteoarthritis, left knee.  POSTOPERATIVE DIAGNOSES: 1. Unstable right total knee arthroplasty. 2. Osteoarthritis, left knee.  PROCEDURES: 1. Right knee polyethylene revision. 2. Left knee cortisone injection.  SURGEON:  Gaynelle Arabian, M.D.  ASSISTANT:  Alexzandrew L. Perkins, P.A.C.  ANESTHESIA:  Spinal and adductor canal block on the right.  ESTIMATED BLOOD LOSS:  Minimal.  DRAINS:  Hemovac x1, right knee.  TOURNIQUET TIME:  25 minutes at 300 mmHg, right.  COMPLICATIONS:  None.  CONDITION:  Stable to recovery.  BRIEF CLINICAL NOTE:  Terri [name] is a 46 year old female, had a total knee arthroplasty done several years ago.  About 2-3 years ago, she had a fall and the knee became unstable.  Revision surgery has been recommended, but due to other issues, she was unable to pursue it until now.  She has an unstable total knee, presents now for polyethylene versus total knee arthroplasty revision.  PROCEDURE IN DETAIL:  After successful administration of adductor canal block and spinal anesthetic, a tourniquet was placed high on the right thigh and right lower extremity prepped and draped in usual sterile fashion.  Extremities wrapped in Esmarch and tourniquet inflated to 300 mmHg.  Midline incision was made with a 10-blade through the subcutaneous tissue to the level to the extensor mechanism.  A fresh blade was used to make a medial parapatellar arthrotomy.  We did not encounter any fluid in the joint.  Soft tissue over the proximal medial tibia subperiosteally elevated to the joint line with a knife and  into the semimembranosus bursa with a Cobb elevator.  Soft tissue laterally was elevated with attention being paid to avoiding the patellar tendon on tibial tubercle.  We examined the knee and she had gross varus, valgus and anterior-posterior laxity throughout all ranges of motion. Flexed the knee to 90 degrees, was able to evert the patella.  We did a thorough synovectomy prior to this.  It was a 6-mm thick polyethylene for size 6 tibial tray and size 6 femur.  I was able to remove the polyethylene.  We trialed with a 10-mm thickness and I was able to reduce that.  With a 10, full extension was achieved with excellent varus, valgus and anterior-posterior balance throughout full range of motion.  I inspected all of the prosthetic interface surfaces and there was no evidence of any loosening of either the femoral or tibial prosthesis.  Similarly, the patellar prosthesis was well fixed and did not have any wear.  We then placed a permanent 10-mm posterior- stabilized rotating platform insert for the Attune tibial tray.  The knee was reduced and again, there was excellent stability throughout full range of motion.  I was able to get her flexed back to about 125 degrees.  The wound was then copiously irrigated with saline solution and 20 mL of Exparel mixed with 40 mL of saline and then injected into the subcutaneous tissues, the extensor mechanism, the periosteum of the femur and posterior capsule.  The wound was then copiously irrigated with saline solution and the arthrotomy closed over Hemovac drain with a running #1 Stratafix suture.  The tourniquet was then released, total tourniquet time about 26 minutes.  Minor bleeding was stopped with electrocautery.  Subcutaneous tissue was then closed with interrupted 2- 0 Vicryl and subcuticular running 4-0 Monocryl.  Incision was cleaned and dried, and Steri-Strips and a bulky sterile dressing applied.  She was placed into a knee immobilizer  and then awakened.  The left knee was then addressed.  After a sterile prep with Betadine, I injected the left knee joint with 80 mg of Depo-Medrol with no problems. Band-Aid was placed.  Note that a surgical assistant was a medical necessity for this procedure to do it in a safe and expeditious manner.  Surgical assistant was necessary for retraction of vital ligaments, neurovascular structures as well as for proper positioning of the limb for accurate placement of the implants.     Gaynelle Arabian, M.D.     FA/MEDQ  D:  08/29/2017  T:  08/30/2017  Job:  [id number]

## 2017-08-30 NOTE — Evaluation (Signed)
Physical Therapy Evaluation [name] MRN: [id number] DOB: Jul 04, 1971 Today's Date: 08/30/2017   History of Present Illness  Pt s/p R TKR liner revision 2* instability.  Pt with hx of DM and R TKR in 2015  Clinical Impression  Pt s/p R TKR revision and presents with decreased R LE strength/ROM and post op pain limiting functional mobility.  Pt plans dc home with assist of family/friends and follow up HHPT starting Monday 09/03/17.    Follow Up Recommendations Outpatient PT;DC plan and follow up therapy as arranged by surgeon    Equipment Recommendations  None recommended by PT    Recommendations for Other Services       Precautions / Restrictions Precautions Precautions: Knee;Fall Required Braces or Orthoses: Knee Immobilizer - Right Knee Immobilizer - Right: Discontinue once straight leg raise with < 10 degree lag(Pt performed IND SLR this am) Restrictions Weight Bearing Restrictions: No Other Position/Activity Restrictions: WBAT      Mobility  Bed Mobility Overal bed mobility: Needs Assistance Bed Mobility: Supine to Sit     Supine to sit: Min guard     General bed mobility comments: cues for sequence and use of L LE to self assist  Transfers Overall transfer level: Needs assistance Equipment used: Rolling walker (2 wheeled) Transfers: Sit to/from Stand Sit to Stand: Min guard         General transfer comment: cues for LE management and use of UEs to self assist  Ambulation/Gait Ambulation/Gait assistance: Min assist;Min guard Ambulation Distance (Feet): 140 Feet Assistive device: Rolling walker (2 wheeled) Gait Pattern/deviations: Step-to pattern;Step-through pattern;Decreased step length - right;Decreased step length - left;Shuffle;Trunk flexed Gait velocity: decr Gait velocity interpretation: Below normal speed for age/gender General Gait Details: cues for posture, position from RW and initial sequence  Stairs             Wheelchair Mobility    Modified Rankin (Stroke Patients Only)       Balance Overall balance assessment: No apparent balance deficits (not formally assessed)                                           Pertinent Vitals/Pain Pain Assessment: 0-10 Pain Score: 3  Pain Location: R knee Pain Descriptors / Indicators: Aching;Sore Pain Intervention(s): Limited activity within patient's tolerance;Monitored during session;Premedicated before session;Ice applied    Home Living Family/patient expects to be discharged to:: Private residence Living Arrangements: Children;Non-relatives/Friends Available Help at Discharge: Family;Friend(s) Type of Home: House Home Access: Level entry     Home Layout: One level Home Equipment: Walker - 2 wheels;Crutches;Shower seat Additional Comments: Pt is staying with a friend initially - home information applys to same    Prior Function Level of Independence: Independent               Hand Dominance        Extremity/Trunk Assessment   Upper Extremity Assessment Upper Extremity Assessment: Overall WFL for tasks assessed    Lower Extremity Assessment Lower Extremity Assessment: RLE deficits/detail RLE Deficits / Details: 3/5 quads with IND SLR and AAROM at knee -8 - 80    Cervical / Trunk Assessment Cervical / Trunk Assessment: Normal  Communication   Communication: No difficulties  Cognition Arousal/Alertness: Awake/alert Behavior During Therapy: WFL for tasks assessed/performed Overall Cognitive Status: Within Functional Limits for tasks assessed  General Comments      Exercises Total Joint Exercises Ankle Circles/Pumps: AROM;Both;20 reps;Supine Quad Sets: AROM;Both;10 reps;Supine Heel Slides: AAROM;Right;15 reps;Supine Straight Leg Raises: AAROM;AROM;Right;15 reps;Supine   Assessment/Plan    PT Assessment Patient needs continued PT services  PT  Problem List Decreased strength;Decreased range of motion;Decreased activity tolerance;Decreased mobility;Decreased knowledge of use of DME;Pain       PT Treatment Interventions DME instruction;Gait training;Stair training;Functional mobility training;Therapeutic activities;Therapeutic exercise;Patient/family education    PT Goals (Current goals can be found in the Care Plan section)  Acute Rehab PT Goals Patient Stated Goal: Regain IND and walk without pain and without knee brace PT Goal Formulation: With patient Time For Goal Achievement: 09/01/17 Potential to Achieve Goals: Good    Frequency 7X/week   Barriers to discharge        Co-evaluation               AM-PAC PT "6 Clicks" Daily Activity  Outcome Measure Difficulty turning over in bed (including adjusting bedclothes, sheets and blankets)?: A Lot Difficulty moving from lying on back to sitting on the side of the bed? : A Lot Difficulty sitting down on and standing up from a chair with arms (e.g., wheelchair, bedside commode, etc,.)?: A Lot Help needed moving to and from a bed to chair (including a wheelchair)?: A Little Help needed walking in hospital room?: A Little Help needed climbing 3-5 steps with a railing? : A Little 6 Click Score: 15    End of Session Equipment Utilized During Treatment: Gait belt Activity Tolerance: Patient tolerated treatment well Patient left: in chair;with call bell/phone within reach Nurse Communication: Mobility status PT Visit Diagnosis: Difficulty in walking, not elsewhere classified (R26.2)    Time: 0930-1005 PT Time Calculation (min) (ACUTE ONLY): 35 min   Charges:   PT Evaluation $PT Eval Low Complexity: 1 Low PT Treatments $Therapeutic Exercise: 8-22 mins   PT G Codes:   PT G-Codes **NOT FOR INPATIENT CLASS** Functional Assessment Tool Used: Clinical judgement Functional Limitation: Mobility: Walking and moving around Mobility: Walking and Moving Around Current  Status (Q5956): At least 20 percent but less than 40 percent impaired, limited or restricted Mobility: Walking and Moving Around Goal Status [phone]): At least 1 percent but less than 20 percent impaired, limited or restricted    Pg [phone]   Darshana Curnutt 08/30/2017, 11:57 AM

## 2017-08-30 NOTE — Progress Notes (Signed)
   Subjective: 1 Day Post-Op Procedure(s) (LRB): Right knee polyethylene versus total knee arthroplasty revision injection left knee (Right) Patient reports pain as mild.   Patient seen in rounds with Dr. Wynelle Link. Patient is well, but has had some minor complaints of pain in the knee, requiring pain medications We will start therapy today.  If they do well with therapy and meets all goals, then will allow home later this afternoon following therapy. Plan is to go Home after hospital stay.  Objective: Vital signs in last 24 hours: Temp:  [97.6 F (36.4 C)-98.5 F (36.9 C)] 97.6 F (36.4 C) (12/13 0643) Pulse Rate:  [64-177] 64 (12/13 0643) Resp:  [9-24] 16 (12/13 0643) BP: (88-139)/(54-85) 108/64 (12/13 0643) SpO2:  [95 %-100 %] 98 % (12/13 0643) Weight:  [98.9 kg (218 lb)] 98.9 kg (218 lb) (12/12 1142)  Intake/Output from previous day:  Intake/Output Summary (Last 24 hours) at 08/30/2017 0808 Last data filed at 08/30/2017 0655 Gross per 24 hour  Intake 5907.92 ml  Output 4455 ml  Net 1452.92 ml    Intake/Output this shift: No intake/output data recorded.  Labs: Recent Labs    08/27/17 1359 08/30/17 0557  HGB 13.6 12.6   Recent Labs    08/27/17 1359 08/30/17 0557  WBC 7.4 10.4  RBC 4.95 4.54  HCT 42.8 38.7  PLT 212 207   Recent Labs    08/27/17 1359 08/30/17 0557  NA 139 140  K 4.3 4.2  CL 103 108  CO2 29 26  BUN 11 11  CREATININE 0.41* 0.37*  GLUCOSE 102* 145*  CALCIUM 9.6 8.8*   Recent Labs    08/27/17 1359  INR 0.92    EXAM General - Patient is Alert, Appropriate and Oriented Extremity - Neurovascular intact Sensation intact distally Intact pulses distally Dorsiflexion/Plantar flexion intact Dressing - dressing C/D/I Motor Function - intact, moving foot and toes well on exam.  Hemovac pulled without difficulty.  Past Medical History:  Diagnosis Date  . Anxiety   . Arthritis    knee pain  . Atypical chest pain   . Cancer ([place])    skin cancer neck removed  . Complication of anesthesia    SLIGHT NAUSEA  . Diabetes mellitus without complication ([place])    type 2  . Diastasis recti   . Headache    hx of migraines  . Hyperlipidemia   . Hypertension   . Malaise and fatigue   . Moderate obesity   . Panic attack   . PONV (postoperative nausea and vomiting)   . Sleep apnea    cpap  . Snoring     Assessment/Plan: 1 Day Post-Op Procedure(s) (LRB): Right knee polyethylene versus total knee arthroplasty revision injection left knee (Right) Principal Problem:   Failed total knee arthroplasty ([facility]CC)  Estimated body mass index is 34.14 kg/m as calculated from the following:   Height as of this encounter: 5\' 7"  (1.702 m).   Weight as of this encounter: 98.9 kg (218 lb). Advance diet Up with therapy Discharge home - outpatient therapy  DVT Prophylaxis - Xarelto Weight-Bearing as tolerated to right leg D/C O2 and Pulse OX and try on Room Air  Plan for home:  Diet - Cardiac diet and Diabetic diet Follow up - in 2 weeks Activity - WBAT Disposition - Home Condition Upon Discharge - Stable D/C Meds - See DC Summary DVT Prophylaxis - Gibson, PA-C Orthopaedic Surgery

## 2017-08-30 NOTE — Progress Notes (Signed)
OT Cancellation Note  [name] MRN: [id number] DOB: 11-Mar-1971   Cancelled Treatment:    Reason Eval/Treat Not Completed: OT screened, no needs identified, will sign off. Patient recalls all OT techniques from prior R TKA. Has all needed DME. Patient states she has no OT needs at this time. Will sign off.  Sheridyn Canino A Daxson Reffett 08/30/2017, 9:44 AM

## 2017-08-30 NOTE — Progress Notes (Signed)
Physical Therapy Treatment Patient Details Name: Anastyn Ayars MRN: [id number] DOB: April 08, 1971 Today's Date: 08/30/2017    History of Present Illness Pt s/p R TKR liner revision 2* instability.  Pt with hx of DM and R TKR in 2015    PT Comments    POD # 1 pm session Pt just amb from bathroom so tx session focused on HEP.  Pt given handout and performed all TE's instructed on proper tech and freq as well as use of ICE.     Follow Up Recommendations  Outpatient PT;DC plan and follow up therapy as arranged by surgeon     Equipment Recommendations  None recommended by PT    Recommendations for Other Services       Precautions / Restrictions Precautions Precautions: Knee;Fall Restrictions Weight Bearing Restrictions: No Other Position/Activity Restrictions: WBAT       Balance                                            Cognition Arousal/Alertness: Awake/alert Behavior During Therapy: WFL for tasks assessed/performed Overall Cognitive Status: Within Functional Limits for tasks assessed                                        Exercises   Total Knee Replacement TE's 10 reps B LE ankle pumps 10 reps towel squeezes 10 reps knee presses 10 reps heel slides  10 reps SAQ's 10 reps SLR's 10 reps ABD  5 reps sitting TE's  Followed by ICE     General Comments        Pertinent Vitals/Pain Pain Assessment: 0-10 Pain Score: 4  Pain Location: R knee during TE's Pain Descriptors / Indicators: Aching;Sore;Operative site guarding Pain Intervention(s): Monitored during session;Repositioned;Ice applied    Home Living                      Prior Function            PT Goals (current goals can now be found in the care plan section) Progress towards PT goals: Progressing toward goals    Frequency    7X/week      PT Plan Current plan remains appropriate    Co-evaluation              AM-PAC PT "6 Clicks" Daily  Activity  Outcome Measure  Difficulty turning over in bed (including adjusting bedclothes, sheets and blankets)?: A Lot Difficulty moving from lying on back to sitting on the side of the bed? : A Lot Difficulty sitting down on and standing up from a chair with arms (e.g., wheelchair, bedside commode, etc,.)?: A Lot Help needed moving to and from a bed to chair (including a wheelchair)?: A Little Help needed walking in hospital room?: A Little Help needed climbing 3-5 steps with a railing? : A Little 6 Click Score: 15    End of Session Equipment Utilized During Treatment: Gait belt Activity Tolerance: Patient tolerated treatment well Patient left: in chair;with call bell/phone within reach Nurse Communication: (pt ready for D/C to home) PT Visit Diagnosis: Difficulty in walking, not elsewhere classified (R26.2)     Time: 1315-1340 PT Time Calculation (min) (ACUTE ONLY): 25 min  Charges:  $Therapeutic Exercise: 23-37 mins  G Codes:       {Ketzaly Cardella  PTA [facility]  Acute  Rehab Pager      [phone]

## 2017-08-30 NOTE — Plan of Care (Signed)
Plan of care discussed with patient.  Denies questions at this time.

## 2017-08-30 NOTE — Discharge Summary (Signed)
Physician Discharge Summary   Patient ID: Terri [name] MRN: [id number] DOB/AGE: Jan 16, 1971 46 y.o.  Admit date: 08/29/2017 Discharge date: 08-30-17  Primary Diagnosis:  1. Unstable right total knee arthroplasty. 2. Osteoarthritis, left knee.   Admission Diagnoses:  Past Medical History:  Diagnosis Date  . Anxiety   . Arthritis    knee pain  . Atypical chest pain   . Cancer ([place])    skin cancer neck removed  . Complication of anesthesia    SLIGHT NAUSEA  . Diabetes mellitus without complication ([place])    type 2  . Diastasis recti   . Headache    hx of migraines  . Hyperlipidemia   . Hypertension   . Malaise and fatigue   . Moderate obesity   . Panic attack   . PONV (postoperative nausea and vomiting)   . Sleep apnea    cpap  . Snoring    Discharge Diagnoses:   Principal Problem:   Failed total knee arthroplasty ([place])  Estimated body mass index is 34.14 kg/m as calculated from the following:   Height as of this encounter: _0  (1.702 m).   Weight as of this encounter: 98.9 kg (218 lb).  Procedure:  Procedure(s) (LRB): Right knee polyethylene versus total knee arthroplasty revision injection left knee (Right)   Consults: None  HPI: Terri [name] is a 46 year old female, had a total knee arthroplasty done several years ago.  About 2-3 years ago, she had a fall and the knee became unstable.  Revision surgery has been recommended, but due to other issues, she was unable to pursue it until now.  She has an unstable total knee, presents now for polyethylene versus total knee arthroplasty revision.   Laboratory Data: Admission on 08/29/2017  Component Date Value Ref Range Status  . Glucose-Capillary 08/29/2017 118* 65 - 99 mg/dL Final  . Glucose-Capillary 08/29/2017 85  65 - 99 mg/dL Final  . Comment 1 08/29/2017 Document in Chart   Final  . Glucose-Capillary 08/29/2017 142* 65 - 99 mg/dL Final  . WBC 08/30/2017 10.4  4.0 - 10.5 K/uL Final  . RBC 08/30/2017 4.54   3.87 - 5.11 MIL/uL Final  . Hemoglobin 08/30/2017 12.6  12.0 - 15.0 g/dL Final  . HCT 08/30/2017 38.7  36.0 - 46.0 % Final  . MCV 08/30/2017 85.2  78.0 - 100.0 fL Final  . MCH 08/30/2017 27.8  26.0 - 34.0 pg Final  . MCHC 08/30/2017 32.6  30.0 - 36.0 g/dL Final  . RDW 08/30/2017 13.6  11.5 - 15.5 % Final  . Platelets 08/30/2017 207  150 - 400 K/uL Final  . Sodium 08/30/2017 140  135 - 145 mmol/L Final  . Potassium 08/30/2017 4.2  3.5 - 5.1 mmol/L Final  . Chloride 08/30/2017 108  101 - 111 mmol/L Final  . CO2 08/30/2017 26  22 - 32 mmol/L Final  . Glucose, Bld 08/30/2017 145* 65 - 99 mg/dL Final  . BUN 08/30/2017 11  6 - 20 mg/dL Final  . Creatinine, Ser 08/30/2017 0.37* 0.44 - 1.00 mg/dL Final  . Calcium 08/30/2017 8.8* 8.9 - 10.3 mg/dL Final  . GFR calc non Af Amer 08/30/2017 >60  >60 mL/min Final  . GFR calc Af Amer 08/30/2017 >60  >60 mL/min Final   Comment: (NOTE) The eGFR has been calculated using the CKD EPI equation. This calculation has not been validated in all clinical situations. eGFR's persistently <60 mL/min signify possible Chronic Kidney Disease.   . Anion gap 08/30/2017 6  5 - 15 Final  . Glucose-Capillary 08/29/2017 229* 65 - 99 mg/dL Final  . Glucose-Capillary 08/30/2017 119* 65 - 99 mg/dL Final  Hospital Outpatient Visit on 08/27/2017  Component Date Value Ref Range Status  . aPTT 08/27/2017 28  24 - 36 seconds Final  . WBC 08/27/2017 7.4  4.0 - 10.5 K/uL Final  . RBC 08/27/2017 4.95  3.87 - 5.11 MIL/uL Final  . Hemoglobin 08/27/2017 13.6  12.0 - 15.0 g/dL Final  . HCT 08/27/2017 42.8  36.0 - 46.0 % Final  . MCV 08/27/2017 86.5  78.0 - 100.0 fL Final  . MCH 08/27/2017 27.5  26.0 - 34.0 pg Final  . MCHC 08/27/2017 31.8  30.0 - 36.0 g/dL Final  . RDW 08/27/2017 13.9  11.5 - 15.5 % Final  . Platelets 08/27/2017 212  150 - 400 K/uL Final  . Sodium 08/27/2017 139  135 - 145 mmol/L Final  . Potassium 08/27/2017 4.3  3.5 - 5.1 mmol/L Final  . Chloride 08/27/2017  103  101 - 111 mmol/L Final  . CO2 08/27/2017 29  22 - 32 mmol/L Final  . Glucose, Bld 08/27/2017 102* 65 - 99 mg/dL Final  . BUN 08/27/2017 11  6 - 20 mg/dL Final  . Creatinine, Ser 08/27/2017 0.41* 0.44 - 1.00 mg/dL Final  . Calcium 08/27/2017 9.6  8.9 - 10.3 mg/dL Final  . Total Protein 08/27/2017 7.2  6.5 - 8.1 g/dL Final  . Albumin 08/27/2017 4.2  3.5 - 5.0 g/dL Final  . AST 08/27/2017 27  15 - 41 U/L Final  . ALT 08/27/2017 29  14 - 54 U/L Final  . Alkaline Phosphatase 08/27/2017 119  38 - 126 U/L Final  . Total Bilirubin 08/27/2017 0.6  0.3 - 1.2 mg/dL Final  . GFR calc non Af Amer 08/27/2017 >60  >60 mL/min Final  . GFR calc Af Amer 08/27/2017 >60  >60 mL/min Final   Comment: (NOTE) The eGFR has been calculated using the CKD EPI equation. This calculation has not been validated in all clinical situations. eGFR's persistently <60 mL/min signify possible Chronic Kidney Disease.   . Anion gap 08/27/2017 7  5 - 15 Final  . Prothrombin Time 08/27/2017 12.2  11.4 - 15.2 seconds Final  . INR 08/27/2017 0.92   Final  . ABO/RH(D) 08/27/2017 A POS   Final  . Antibody Screen 08/27/2017 NEG   Final  . Sample Expiration 08/27/2017 09/01/2017   Final  . Extend sample reason 08/27/2017 NO TRANSFUSIONS OR PREGNANCY IN THE PAST 3 MONTHS   Final  . Hgb A1c MFr Bld 08/27/2017 7.8* 4.8 - 5.6 % Final   Comment: (NOTE) Pre diabetes:          5.7%-6.4% Diabetes:              >6.4% Glycemic control for   <7.0% adults with diabetes   . Mean Plasma Glucose 08/27/2017 177.16  mg/dL Final   Performed at [facility] [address], [place], [place] [address]  . MRSA, PCR 08/27/2017 NEGATIVE  NEGATIVE Final  . Staphylococcus aureus 08/27/2017 NEGATIVE  NEGATIVE Final   Comment: (NOTE) The Xpert SA Assay (FDA approved for NASAL specimens in patients 18 years of age and older), is one component of a comprehensive surveillance program. It is not intended to diagnose infection nor to guide or  monitor treatment.   . Glucose-Capillary 08/27/2017 124* 65 - 99 mg/dL Final     X-Rays:No results found.  EKG:  Orders placed or performed during the hospital encounter of 08/29/17  . EKG 12-Lead  . EKG 12-Lead  . EKG 12-Lead  . EKG 12-Lead  . EKG 12-Lead  . EKG 12-Lead     Hospital Course: Caprina Wussow is a 46 y.o. who was admitted to [facility] They were brought to the operating room on 08/29/2017 and underwent Procedure(s): Right knee polyethylene versus total knee arthroplasty revision injection left knee.  Patient tolerated the procedure well and was later transferred to the recovery room and then to the orthopaedic floor for postoperative care.  They were given PO and IV analgesics for pain control following their surgery.  They were given 24 hours of postoperative antibiotics of  Anti-infectives (From admission, onward)   Start     Dose/Rate Route Frequency Ordered Stop   08/29/17 2000  ceFAZolin (ANCEF) IVPB 2g/100 mL premix     2 g 200 mL/hr over 30 Minutes Intravenous Every 6 hours 08/29/17 1545 08/30/17 0228   08/29/17 1130  ceFAZolin (ANCEF) 2-4 GM/100ML-% IVPB    Comments:  Bridget Hartshorn   : cabinet override      08/29/17 1130 08/29/17 1304   08/29/17 1115  ceFAZolin (ANCEF) IVPB 2g/100 mL premix     2 g 200 mL/hr over 30 Minutes Intravenous On call to O.R. 08/29/17 1115 08/29/17 1334     and started on DVT prophylaxis in the form of Xarelto.   PT and OT were ordered for total joint protocol.  Discharge planning consulted to help with postop disposition and equipment needs.  Patient had a decent night on the evening of surgery.  They started to get up OOB with therapy on day one. Hemovac drain was pulled without difficulty. Dressing was checked and was okay. Patient was seen in rounds and was ready to go home following therapy goals.  Plan for home:  Diet - Cardiac diet and Diabetic diet Follow up - in 2 weeks Activity - WBAT Disposition -  Home Condition Upon Discharge - Stable D/C Meds - See DC Summary DVT Prophylaxis - Xarelto    Discharge Instructions    Call MD / Call 911   Complete by:  As directed    If you experience chest pain or shortness of breath, CALL 911 and be transported to the hospital emergency room.  If you develope a fever above 101 F, pus (white drainage) or increased drainage or redness at the wound, or calf pain, call your surgeon's office.   Change dressing   Complete by:  As directed    Change dressing daily with sterile 4 x 4 inch gauze dressing and apply TED hose. Do not submerge the incision under water.   Constipation Prevention   Complete by:  As directed    Drink plenty of fluids.  Prune juice may be helpful.  You may use a stool softener, such as Colace (over the counter) 100 mg twice a day.  Use MiraLax (over the counter) for constipation as needed.   Diet - low sodium heart healthy   Complete by:  As directed    Diet Carb Modified   Complete by:  As directed    Discharge instructions   Complete by:  As directed    Take Xarelto for two and a half more weeks, then discontinue Xarelto. Once the patient has completed the Xarelto, they may resume the 81 mg Aspirin.   Pick up stool softner and laxative for home use following surgery while on pain  medications. Do not submerge incision under water. Please use good hand washing techniques while changing dressing each day. May shower starting three days after surgery. Please use a clean towel to pat the incision dry following showers. Continue to use ice for pain and swelling after surgery. Do not use any lotions or creams on the incision until instructed by your surgeon.  Wear both TED hose on both legs during the day every day for three weeks, but may remove the TED hose at night at home.  Postoperative Constipation Protocol  Constipation - defined medically as fewer than three stools per week and severe constipation as less than one  stool per week.  One of the most common issues patients have following surgery is constipation.  Even if you have a regular bowel pattern at home, your normal regimen is likely to be disrupted due to multiple reasons following surgery.  Combination of anesthesia, postoperative narcotics, change in appetite and fluid intake all can affect your bowels.  In order to avoid complications following surgery, here are some recommendations in order to help you during your recovery period.  Colace (docusate) - Pick up an over-the-counter form of Colace or another stool softener and take twice a day as long as you are requiring postoperative pain medications.  Take with a full glass of water daily.  If you experience loose stools or diarrhea, hold the colace until you stool forms back up.  If your symptoms do not get better within 1 week or if they get worse, check with your doctor.  Dulcolax (bisacodyl) - Pick up over-the-counter and take as directed by the product packaging as needed to assist with the movement of your bowels.  Take with a full glass of water.  Use this product as needed if not relieved by Colace only.   MiraLax (polyethylene glycol) - Pick up over-the-counter to have on hand.  MiraLax is a solution that will increase the amount of water in your bowels to assist with bowel movements.  Take as directed and can mix with a glass of water, juice, soda, coffee, or tea.  Take if you go more than two days without a movement. Do not use MiraLax more than once per day. Call your doctor if you are still constipated or irregular after using this medication for 7 days in a row.  If you continue to have problems with postoperative constipation, please contact the office for further assistance and recommendations.  If you experience "the worst abdominal pain ever" or develop nausea or vomiting, please contact the office immediatly for further recommendations for treatment.   Do not put a pillow under the knee.  Place it under the heel.   Complete by:  As directed    Do not sit on low chairs, stoools or toilet seats, as it may be difficult to get up from low surfaces   Complete by:  As directed    Driving restrictions   Complete by:  As directed    No driving until released by the physician.   Increase activity slowly as tolerated   Complete by:  As directed    Lifting restrictions   Complete by:  As directed    No lifting until released by the physician.   Patient may shower   Complete by:  As directed    You may shower without a dressing once there is no drainage.  Do not wash over the wound.  If drainage remains, do not shower until drainage stops.  TED hose   Complete by:  As directed    Use stockings (TED hose) for 3 weeks on both leg(s).  You may remove them at night for sleeping.   Weight bearing as tolerated   Complete by:  As directed    Laterality:  right   Extremity:  Lower     Allergies as of 08/30/2017   No Known Allergies     Medication List    STOP taking these medications   aspirin EC 81 MG tablet     TAKE these medications   atorvastatin 80 MG tablet Commonly known as:  LIPITOR Take 80 mg by mouth daily.   clonazePAM 1 MG tablet Commonly known as:  KLONOPIN Take 1 mg by mouth 3 (three) times daily as needed for anxiety.   CONTRAVE 8-90 MG Tb12 Generic drug:  Naltrexone-Bupropion HCl ER Take 2 tablets by mouth 2 (two) times daily.   lisinopril 20 MG tablet Commonly known as:  PRINIVIL,ZESTRIL Take 20 mg by mouth at bedtime.   methocarbamol 500 MG tablet Commonly known as:  ROBAXIN Take 1 tablet (500 mg total) by mouth every 6 (six) hours as needed for muscle spasms.   oxyCODONE 5 MG immediate release tablet Commonly known as:  Oxy IR/ROXICODONE Take 1 tablet (5 mg total) by mouth every 4 (four) hours as needed for moderate pain or severe pain.   rivaroxaban 10 MG Tabs tablet Commonly known as:  XARELTO Take 1 tablet (10 mg total) by mouth daily  with breakfast. Take Xarelto for two and a half more weeks following discharge from the hospital, then discontinue Xarelto. Once the patient has completed the Xarelto, they may resume the 81 mg Aspirin.   sertraline 100 MG tablet Commonly known as:  ZOLOFT Take 100 mg by mouth at bedtime.   SOLIQUA 100-33 UNT-MCG/ML Sopn Generic drug:  Insulin Glargine-Lixisenatide Inject 20 Units into the skin daily.   SYNJARDY 12.5-500 MG Tabs Generic drug:  Empagliflozin-Metformin HCl Take 1 tablet by mouth 2 (two) times daily.            Discharge Care Instructions  (From admission, onward)        Start     Ordered   08/30/17 0000  Weight bearing as tolerated    Question Answer Comment  Laterality right   Extremity Lower      08/30/17 0814   08/30/17 0000  Change dressing    Comments:  Change dressing daily with sterile 4 x 4 inch gauze dressing and apply TED hose. Do not submerge the incision under water.   08/30/17 9562     Follow-up Information    Gaynelle Arabian, MD. Schedule an appointment as soon as possible for a visit on 09/13/2017.   Specialty:  Orthopedic Surgery Contact information: [address] [place] [address] [phone]           Signed: Arlee Muslim, PA-C Orthopaedic Surgery 08/30/2017, 8:15 AM

## 2017-09-03 DIAGNOSIS — G4733 Obstructive sleep apnea (adult) (pediatric): Secondary | ICD-10-CM | POA: Diagnosis not present

## 2017-09-03 DIAGNOSIS — M25561 Pain in right knee: Secondary | ICD-10-CM | POA: Diagnosis not present

## 2017-09-05 DIAGNOSIS — M25561 Pain in right knee: Secondary | ICD-10-CM | POA: Diagnosis not present

## 2017-09-07 DIAGNOSIS — M25561 Pain in right knee: Secondary | ICD-10-CM | POA: Diagnosis not present

## 2017-09-12 DIAGNOSIS — M25561 Pain in right knee: Secondary | ICD-10-CM | POA: Diagnosis not present

## 2017-09-14 DIAGNOSIS — M25561 Pain in right knee: Secondary | ICD-10-CM | POA: Diagnosis not present

## 2017-09-17 DIAGNOSIS — M25561 Pain in right knee: Secondary | ICD-10-CM | POA: Diagnosis not present

## 2017-09-19 DIAGNOSIS — M25561 Pain in right knee: Secondary | ICD-10-CM | POA: Diagnosis not present

## 2017-09-19 DIAGNOSIS — E7849 Other hyperlipidemia: Secondary | ICD-10-CM | POA: Diagnosis not present

## 2017-09-19 DIAGNOSIS — E1165 Type 2 diabetes mellitus with hyperglycemia: Secondary | ICD-10-CM | POA: Diagnosis not present

## 2017-09-20 DIAGNOSIS — M25561 Pain in right knee: Secondary | ICD-10-CM | POA: Diagnosis not present

## 2017-09-25 DIAGNOSIS — M25561 Pain in right knee: Secondary | ICD-10-CM | POA: Diagnosis not present

## 2017-09-26 DIAGNOSIS — M25561 Pain in right knee: Secondary | ICD-10-CM | POA: Diagnosis not present

## 2017-09-28 DIAGNOSIS — M25561 Pain in right knee: Secondary | ICD-10-CM | POA: Diagnosis not present

## 2017-10-01 DIAGNOSIS — M25569 Pain in unspecified knee: Secondary | ICD-10-CM | POA: Diagnosis not present

## 2017-10-02 DIAGNOSIS — Z471 Aftercare following joint replacement surgery: Secondary | ICD-10-CM | POA: Diagnosis not present

## 2017-10-02 DIAGNOSIS — Z96651 Presence of right artificial knee joint: Secondary | ICD-10-CM | POA: Diagnosis not present

## 2017-10-03 DIAGNOSIS — M25561 Pain in right knee: Secondary | ICD-10-CM | POA: Diagnosis not present

## 2017-10-04 DIAGNOSIS — N760 Acute vaginitis: Secondary | ICD-10-CM | POA: Diagnosis not present

## 2017-10-05 DIAGNOSIS — M25561 Pain in right knee: Secondary | ICD-10-CM | POA: Diagnosis not present

## 2017-10-08 DIAGNOSIS — M25561 Pain in right knee: Secondary | ICD-10-CM | POA: Diagnosis not present

## 2017-10-10 DIAGNOSIS — M25561 Pain in right knee: Secondary | ICD-10-CM | POA: Diagnosis not present

## 2017-10-12 DIAGNOSIS — M25561 Pain in right knee: Secondary | ICD-10-CM | POA: Diagnosis not present

## 2017-10-15 DIAGNOSIS — M25561 Pain in right knee: Secondary | ICD-10-CM | POA: Diagnosis not present

## 2017-10-17 DIAGNOSIS — M25561 Pain in right knee: Secondary | ICD-10-CM | POA: Diagnosis not present

## 2017-10-19 DIAGNOSIS — M25561 Pain in right knee: Secondary | ICD-10-CM | POA: Diagnosis not present

## 2017-10-22 DIAGNOSIS — G4733 Obstructive sleep apnea (adult) (pediatric): Secondary | ICD-10-CM | POA: Diagnosis not present

## 2017-10-22 DIAGNOSIS — M25561 Pain in right knee: Secondary | ICD-10-CM | POA: Diagnosis not present

## 2017-10-25 DIAGNOSIS — E1165 Type 2 diabetes mellitus with hyperglycemia: Secondary | ICD-10-CM | POA: Diagnosis not present

## 2017-10-25 DIAGNOSIS — J0100 Acute maxillary sinusitis, unspecified: Secondary | ICD-10-CM | POA: Diagnosis not present

## 2017-10-25 DIAGNOSIS — R05 Cough: Secondary | ICD-10-CM | POA: Diagnosis not present

## 2017-10-25 DIAGNOSIS — M25561 Pain in right knee: Secondary | ICD-10-CM | POA: Diagnosis not present

## 2017-10-29 DIAGNOSIS — M25561 Pain in right knee: Secondary | ICD-10-CM | POA: Diagnosis not present

## 2017-11-01 DIAGNOSIS — M25561 Pain in right knee: Secondary | ICD-10-CM | POA: Diagnosis not present

## 2017-11-06 DIAGNOSIS — M25561 Pain in right knee: Secondary | ICD-10-CM | POA: Diagnosis not present

## 2017-11-09 DIAGNOSIS — M25561 Pain in right knee: Secondary | ICD-10-CM | POA: Diagnosis not present

## 2017-11-13 DIAGNOSIS — M25561 Pain in right knee: Secondary | ICD-10-CM | POA: Diagnosis not present

## 2017-11-20 DIAGNOSIS — M25561 Pain in right knee: Secondary | ICD-10-CM | POA: Diagnosis not present

## 2017-11-22 DIAGNOSIS — M25561 Pain in right knee: Secondary | ICD-10-CM | POA: Diagnosis not present

## 2017-11-27 DIAGNOSIS — M25561 Pain in right knee: Secondary | ICD-10-CM | POA: Diagnosis not present

## 2017-11-28 DIAGNOSIS — Z96651 Presence of right artificial knee joint: Secondary | ICD-10-CM | POA: Diagnosis not present

## 2017-11-28 DIAGNOSIS — Z471 Aftercare following joint replacement surgery: Secondary | ICD-10-CM | POA: Diagnosis not present

## 2017-12-03 DIAGNOSIS — G4733 Obstructive sleep apnea (adult) (pediatric): Secondary | ICD-10-CM | POA: Diagnosis not present

## 2017-12-03 DIAGNOSIS — M25561 Pain in right knee: Secondary | ICD-10-CM | POA: Diagnosis not present

## 2017-12-05 DIAGNOSIS — M25561 Pain in right knee: Secondary | ICD-10-CM | POA: Diagnosis not present

## 2018-01-03 DIAGNOSIS — E1165 Type 2 diabetes mellitus with hyperglycemia: Secondary | ICD-10-CM | POA: Diagnosis not present

## 2018-01-03 DIAGNOSIS — Z0000 Encounter for general adult medical examination without abnormal findings: Secondary | ICD-10-CM | POA: Diagnosis not present

## 2018-01-07 DIAGNOSIS — G4733 Obstructive sleep apnea (adult) (pediatric): Secondary | ICD-10-CM | POA: Diagnosis not present

## 2018-01-14 DIAGNOSIS — Z0000 Encounter for general adult medical examination without abnormal findings: Secondary | ICD-10-CM | POA: Diagnosis not present

## 2018-01-14 DIAGNOSIS — R808 Other proteinuria: Secondary | ICD-10-CM | POA: Diagnosis not present

## 2018-01-14 DIAGNOSIS — N390 Urinary tract infection, site not specified: Secondary | ICD-10-CM | POA: Diagnosis not present

## 2018-01-14 DIAGNOSIS — I10 Essential (primary) hypertension: Secondary | ICD-10-CM | POA: Diagnosis not present

## 2018-01-14 DIAGNOSIS — Z1389 Encounter for screening for other disorder: Secondary | ICD-10-CM | POA: Diagnosis not present

## 2018-01-21 DIAGNOSIS — G4733 Obstructive sleep apnea (adult) (pediatric): Secondary | ICD-10-CM | POA: Diagnosis not present

## 2018-01-31 DIAGNOSIS — M25561 Pain in right knee: Secondary | ICD-10-CM | POA: Diagnosis not present

## 2018-01-31 DIAGNOSIS — Z96651 Presence of right artificial knee joint: Secondary | ICD-10-CM | POA: Diagnosis not present

## 2018-02-05 ENCOUNTER — Other Ambulatory Visit (HOSPITAL_COMMUNITY): Payer: Self-pay | Admitting: Orthopedic Surgery

## 2018-02-05 DIAGNOSIS — Z01419 Encounter for gynecological examination (general) (routine) without abnormal findings: Secondary | ICD-10-CM | POA: Diagnosis not present

## 2018-02-05 DIAGNOSIS — Z1231 Encounter for screening mammogram for malignant neoplasm of breast: Secondary | ICD-10-CM | POA: Diagnosis not present

## 2018-02-05 DIAGNOSIS — Z130 Encounter for screening for diseases of the blood and blood-forming organs and certain disorders involving the immune mechanism: Secondary | ICD-10-CM | POA: Diagnosis not present

## 2018-02-05 DIAGNOSIS — Z1389 Encounter for screening for other disorder: Secondary | ICD-10-CM | POA: Diagnosis not present

## 2018-02-05 DIAGNOSIS — Z96651 Presence of right artificial knee joint: Secondary | ICD-10-CM

## 2018-02-13 ENCOUNTER — Other Ambulatory Visit: Payer: Self-pay | Admitting: Obstetrics and Gynecology

## 2018-02-13 DIAGNOSIS — R928 Other abnormal and inconclusive findings on diagnostic imaging of breast: Secondary | ICD-10-CM

## 2018-02-14 ENCOUNTER — Encounter (HOSPITAL_COMMUNITY)
Admission: RE | Admit: 2018-02-14 | Discharge: 2018-02-14 | Disposition: A | Discharge status: Home / Self Care | Payer: 59 | Source: Ambulatory Visit | Attending: Orthopedic Surgery | Admitting: Orthopedic Surgery

## 2018-02-14 DIAGNOSIS — Z96651 Presence of right artificial knee joint: Secondary | ICD-10-CM | POA: Diagnosis present

## 2018-02-14 MED ORDER — TECHNETIUM TC 99M MEDRONATE IV KIT
21.8000 | PACK | Freq: Once | INTRAVENOUS | Status: AC | PRN
  Administered 2018-02-14: 21.8 via INTRAVENOUS

## 2018-02-18 ENCOUNTER — Other Ambulatory Visit: Payer: Self-pay | Admitting: Obstetrics and Gynecology

## 2018-02-18 ENCOUNTER — Ambulatory Visit
Admission: RE | Admit: 2018-02-18 | Discharge: 2018-02-18 | Disposition: A | Discharge status: Home / Self Care | Payer: 59 | Source: Ambulatory Visit | Attending: Obstetrics and Gynecology | Admitting: Obstetrics and Gynecology

## 2018-02-18 DIAGNOSIS — R928 Other abnormal and inconclusive findings on diagnostic imaging of breast: Secondary | ICD-10-CM

## 2018-02-18 DIAGNOSIS — N6310 Unspecified lump in the right breast, unspecified quadrant: Secondary | ICD-10-CM | POA: Diagnosis not present

## 2018-02-18 DIAGNOSIS — N6320 Unspecified lump in the left breast, unspecified quadrant: Secondary | ICD-10-CM | POA: Diagnosis not present

## 2018-02-19 ENCOUNTER — Other Ambulatory Visit: Payer: Self-pay | Admitting: Obstetrics and Gynecology

## 2018-02-19 DIAGNOSIS — N6310 Unspecified lump in the right breast, unspecified quadrant: Secondary | ICD-10-CM

## 2018-02-20 ENCOUNTER — Ambulatory Visit
Admission: RE | Admit: 2018-02-20 | Discharge: 2018-02-20 | Disposition: A | Discharge status: Home / Self Care | Payer: 59 | Source: Ambulatory Visit | Attending: Obstetrics and Gynecology | Admitting: Obstetrics and Gynecology

## 2018-02-20 ENCOUNTER — Other Ambulatory Visit: Payer: Self-pay | Admitting: Obstetrics and Gynecology

## 2018-02-20 DIAGNOSIS — N6310 Unspecified lump in the right breast, unspecified quadrant: Secondary | ICD-10-CM

## 2018-02-20 DIAGNOSIS — N6311 Unspecified lump in the right breast, upper outer quadrant: Secondary | ICD-10-CM | POA: Diagnosis not present

## 2018-02-20 DIAGNOSIS — N6011 Diffuse cystic mastopathy of right breast: Secondary | ICD-10-CM | POA: Diagnosis not present

## 2018-02-21 DIAGNOSIS — M1711 Unilateral primary osteoarthritis, right knee: Secondary | ICD-10-CM | POA: Diagnosis not present

## 2018-03-04 DIAGNOSIS — G4733 Obstructive sleep apnea (adult) (pediatric): Secondary | ICD-10-CM | POA: Diagnosis not present

## 2018-04-16 DIAGNOSIS — E1165 Type 2 diabetes mellitus with hyperglycemia: Secondary | ICD-10-CM | POA: Diagnosis not present

## 2018-04-16 DIAGNOSIS — I10 Essential (primary) hypertension: Secondary | ICD-10-CM | POA: Diagnosis not present

## 2018-04-16 DIAGNOSIS — G4709 Other insomnia: Secondary | ICD-10-CM | POA: Diagnosis not present

## 2018-04-22 DIAGNOSIS — G4733 Obstructive sleep apnea (adult) (pediatric): Secondary | ICD-10-CM | POA: Diagnosis not present

## 2018-05-23 DIAGNOSIS — M1711 Unilateral primary osteoarthritis, right knee: Secondary | ICD-10-CM | POA: Diagnosis not present

## 2018-06-03 DIAGNOSIS — G4733 Obstructive sleep apnea (adult) (pediatric): Secondary | ICD-10-CM | POA: Diagnosis not present

## 2018-06-06 DIAGNOSIS — Z01810 Encounter for preprocedural cardiovascular examination: Secondary | ICD-10-CM | POA: Diagnosis not present

## 2018-06-06 DIAGNOSIS — I10 Essential (primary) hypertension: Secondary | ICD-10-CM | POA: Diagnosis not present

## 2018-06-06 DIAGNOSIS — E1165 Type 2 diabetes mellitus with hyperglycemia: Secondary | ICD-10-CM | POA: Diagnosis not present

## 2018-07-09 DIAGNOSIS — G4733 Obstructive sleep apnea (adult) (pediatric): Secondary | ICD-10-CM | POA: Diagnosis not present

## 2018-07-22 DIAGNOSIS — G4733 Obstructive sleep apnea (adult) (pediatric): Secondary | ICD-10-CM | POA: Diagnosis not present

## 2018-07-23 NOTE — H&P (Signed)
TOTAL KNEE ADMISSION H&P  Patient is being admitted for right knee polyethylene versus TKA revision  Subjective:  Chief Complaint:right knee pain.  HPI: Terri [name], 47 y.o. female, has a history of pain and functional disability in the right knee and has failed non-surgical conservative treatments for greater than 12 weeks to includeactivity modification and use of knee brace.  Onset of symptoms was abrupt, starting around March 9th following a fall onto the right knee years ago with stable course since that time. The patient noted prior procedures on the knee to include  arthroplasty on the right knee(s).  Patient currently rates pain in the right knee(s) at 8 out of 10 with activity. Patient has worsening of pain with activity and weight bearing, pain that interferes with activities of daily living and joint swelling.  Patient does not have any evidence of prosthesis loosening via XR or nuclear 3-phase bone scan. There is no active infection.  Patient Active Problem List   Diagnosis Date Noted  . Failed total knee arthroplasty ([place]) 11/11/2013  . Hyponatremia 10/02/2013  . Postoperative anemia due to acute blood loss 10/02/2013  . OA (osteoarthritis) of knee 09/29/2013  . BMI 33.0-33.9,adult 03/17/2012  . Diastasis recti 05/08/2011  . GERD 08/23/2009  . CONSTIPATION 08/23/2009  . FLATULENCE-GAS-BLOATING 08/23/2009  . ABDOMINAL PAIN -GENERALIZED 08/23/2009   Past Medical History:  Diagnosis Date  . Anxiety   . Arthritis    knee pain  . Atypical chest pain   . Cancer ([place])    skin cancer neck removed  . Complication of anesthesia    SLIGHT NAUSEA  . Diabetes mellitus without complication ([place])    type 2  . Diastasis recti   . Headache    hx of migraines  . Hyperlipidemia   . Hypertension   . Malaise and fatigue   . Moderate obesity   . Panic attack   . PONV (postoperative nausea and vomiting)   . Sleep apnea    cpap  . Snoring     Past Surgical History:  Procedure  Laterality Date  . CESAREAN SECTION  1998  . JOINT REPLACEMENT     right knee revision vs total knee revision   Dr.Aluisio  08-29-17  . KNEE CLOSED REDUCTION Right 11/12/2013   Procedure: RIGHT CLOSED MANIPULATION KNEE;  Surgeon: Gearlean Alf, MD;  Location: [facility];  Service: Orthopedics;  Laterality: Right;  . KNEE SURGERY     bilateral meniscus  . TOTAL KNEE ARTHROPLASTY Right 09/29/2013   Procedure: RIGHT TOTAL KNEE ARTHROPLASTY;  Surgeon: Gearlean Alf, MD;  Location: [facility];  Service: Orthopedics;  Laterality: Right;  . TOTAL KNEE ARTHROPLASTY WITH REVISION COMPONENTS Right 08/29/2017   Procedure: Right knee polyethylene versus total knee arthroplasty revision injection left knee;  Surgeon: Gaynelle Arabian, MD;  Location: [facility];  Service: Orthopedics;  Laterality: Right;    No current facility-administered medications for this encounter.    Current Outpatient Medications  Medication Sig Dispense Refill Last Dose  . atorvastatin (LIPITOR) 80 MG tablet Take 80 mg by mouth daily.   Past Week at Unknown time  . clonazePAM (KLONOPIN) 1 MG tablet Take 1 mg by mouth 3 (three) times daily as needed for anxiety.   08/29/2017 at 1000  . CONTRAVE 8-90 MG TB12 Take 2 tablets by mouth 2 (two) times daily.  2 Past Month at Unknown time  . Empagliflozin-Metformin HCl (SYNJARDY) 12.5-500 MG TABS Take 1 tablet by mouth 2 (two) times daily.  08/28/2017 at Unknown time  . lisinopril (PRINIVIL,ZESTRIL) 20 MG tablet Take 20 mg by mouth at bedtime.  1 Past Week at Unknown time  . methocarbamol (ROBAXIN) 500 MG tablet Take 1 tablet (500 mg total) by mouth every 6 (six) hours as needed for muscle spasms. 80 tablet 0   . oxyCODONE (OXY IR/ROXICODONE) 5 MG immediate release tablet Take 1 tablet (5 mg total) by mouth every 4 (four) hours as needed for moderate pain or severe pain. 80 tablet 0   . rivaroxaban (XARELTO) 10 MG TABS tablet Take 1 tablet (10 mg total) by mouth daily with breakfast. Take Xarelto for  two and a half more weeks following discharge from the hospital, then discontinue Xarelto. Once the patient has completed the Xarelto, they may resume the 81 mg Aspirin. 20 tablet 0   . sertraline (ZOLOFT) 100 MG tablet Take 100 mg by mouth at bedtime.    Past Week at Unknown time  . SOLIQUA 100-33 UNT-MCG/ML SOPN Inject 20 Units into the skin daily.  4 08/29/2017 at 0730   No Known Allergies  Social History   Tobacco Use  . Smoking status: Former Smoker    Last attempt to quit: 02/17/1996    Years since quitting: 22.4  . Smokeless tobacco: Never Used  Substance Use Topics  . Alcohol use: Yes    Alcohol/week: 0.0 standard drinks    Comment: social    Family History  Problem Relation Age of Onset  . Hypertension Mother   . Hyperlipidemia Father   . Diabetes Father   . Stroke Father        2011     Review of Systems  Constitutional: Negative for chills and fever.  HENT: Negative for congestion, sore throat and tinnitus.   Eyes: Negative for double vision, photophobia and pain.  Respiratory: Negative for cough, shortness of breath and wheezing.   Cardiovascular: Negative for chest pain, palpitations and orthopnea.  Gastrointestinal: Negative for heartburn, nausea and vomiting.  Genitourinary: Negative for dysuria, frequency and urgency.  Musculoskeletal: Positive for joint pain.  Neurological: Negative for dizziness, weakness and headaches.    Objective:  Physical Exam  Well nourished and well developed. General: Alert and oriented x3, cooperative and pleasant, no acute distress. Head: normocephalic, atraumatic, neck supple. Eyes: EOMI. Respiratory: breath sounds clear in all fields, no wheezing, rales, or rhonchi. Cardiovascular: Regular rate and rhythm, no murmurs, gallops or rubs.  Abdomen: non-tender to palpation and soft, normoactive bowel sounds. Musculoskeletal: Walking with a limp without using assisted devices.  Right Knee Exam: No effusion. Minimal soft tissue  swelling. Range of motion is 0-125 degrees. Small amount of varus-valgus laxity in extension and a fair amount of AP laxity in flexion. No gross instability. Calves soft and nontender. Motor function intact in LE. Strength 5/5 LE bilaterally. Neuro: Distal pulses 2+. Sensation to light touch intact in LE.  Vital signs in last 24 hours: Blood pressure: 122/76 mmHg Pulse: 88 bpm  Labs:   Estimated body mass index is 34.14 kg/m as calculated from the following:   Height as of 08/29/17: 5\' 7"  (1.702 m).   Weight as of 08/29/17: 98.9 kg.   Imaging Review Plain radiographs do not demonstrate any prosthetic loosening. She did undergo a bone scan, which also did not show any loosening of the metal components.The overall alignment isneutral. The bone quality appears to be good for age and reported activity level.   Preoperative templating of the joint replacement has been completed, documented,  and submitted to the Operating Room personnel in order to optimize intra-operative equipment management.   Assessment/Plan:  Unstable Right Total Knee Arthroplasty  The risks and benefits of total knee arthroplasty revision were presented and reviewed. The risks due to aseptic loosening, infection, stiffness, patella tracking problems, thromboembolic complications and other imponderables were discussed. The patient acknowledged the explanation, agreed to proceed with the plan and consent was signed. Patient is being admitted for inpatient treatment for surgery, pain control, PT, OT, prophylactic antibiotics, VTE prophylaxis, progressive ambulation and ADL's and discharge planning. The patient is planning to be discharged home with outpatient physical therapy.   Therapy Plans: outpatient therapy at [facility] Disposition: Home with friend Planned DVT Prophylaxis: Aspirin 325 mg BID DME needed: None PCP: Dr. Ardeth Perfect Cardiologist: Dr Einar Gip TXA: IV Allergies: NKDA Anesthesia Concerns:  Nausea/vomiting BMI: 32.2 Last HgbA1c: 6.5%  - Patient was instructed on what medications to stop prior to surgery. - Follow-up visit in 2 weeks with Dr. Wynelle Link - Begin physical therapy following surgery - Pre-operative lab work as pre-surgical testing - Prescriptions will be provided in hospital at time of discharge  Theresa Duty, PA-C Orthopedic Surgery [facility] [place]

## 2018-08-01 DIAGNOSIS — I10 Essential (primary) hypertension: Secondary | ICD-10-CM | POA: Diagnosis not present

## 2018-08-01 DIAGNOSIS — E7849 Other hyperlipidemia: Secondary | ICD-10-CM | POA: Diagnosis not present

## 2018-08-01 DIAGNOSIS — E1169 Type 2 diabetes mellitus with other specified complication: Secondary | ICD-10-CM | POA: Diagnosis not present

## 2018-08-12 NOTE — Progress Notes (Signed)
08-29-17 (Epic) EKG  06-14-18 (Epic) Cardiac Clearance from Dr. Einar Gip on chart.

## 2018-08-12 NOTE — Patient Instructions (Addendum)
Terri [name]  [id number]   Your procedure is scheduled on: 08-21-18    Report to [facility] Main  Entrance    Report to Admitting at 7:30 AM    Call this number if you have problems the morning of surgery [phone]    Remember: Do not eat food or drink liquids :After Midnight.    BRUSH YOUR TEETH MORNING OF SURGERY AND RINSE YOUR MOUTH OUT, NO CHEWING GUM CANDY OR MINTS.     Take these medicines the morning of surgery with A SIP OF WATER: Clonazepam as needed. You may bring and use your eyedrops as needed   DO NOT TAKE ANY DIABETIC MEDICATIONS DAY OF YOUR SURGERY                               You may not have any metal on your body including hair pins and              piercings  Do not wear jewelry, make-up, lotions, powders or perfumes, deodorant             Do not wear nail polish.  Do not shave  48 hours prior to surgery.        Do not bring valuables to the hospital. [place].  Contacts, dentures or bridgework may not be worn into surgery.  Leave suitcase in the car. After surgery it may be brought to your room.      Special Instructions: N/A              Please read over the following fact sheets you were given: _____________________________________________________________________             How to Manage Your Diabetes Before and After Surgery  Why is it important to control my blood sugar before and after surgery? . Improving blood sugar levels before and after surgery helps healing and can limit problems. . A way of improving blood sugar control is eating a healthy diet by: o  Eating less sugar and carbohydrates o  Increasing activity/exercise o  Talking with your doctor about reaching your blood sugar goals . High blood sugars (greater than 180 mg/dL) can raise your risk of infections and slow your recovery, so you will need to focus on controlling your diabetes during the weeks  before surgery. . Make sure that the doctor who takes care of your diabetes knows about your planned surgery including the date and location.  How do I manage my blood sugar before surgery? . Check your blood sugar at least 4 times a day, starting 2 days before surgery, to make sure that the level is not too high or low. o Check your blood sugar the morning of your surgery when you wake up and every 2 hours until you get to the Short Stay unit. . If your blood sugar is less than 70 mg/dL, you will need to treat for low blood sugar: o Do not take insulin. o Treat a low blood sugar (less than 70 mg/dL) with  cup of clear juice (cranberry or apple), 4 glucose tablets, OR glucose gel. o Recheck blood sugar in 15 minutes after treatment (to make sure it is greater than 70 mg/dL). If your blood sugar  is not greater than 70 mg/dL on recheck, call [phone] for further instructions. . Report your blood sugar to the short stay nurse when you get to Short Stay.  . If you are admitted to the hospital after surgery: o Your blood sugar will be checked by the staff and you will probably be given insulin after surgery (instead of oral diabetes medicines) to make sure you have good blood sugar levels. o The goal for blood sugar control after surgery is 80-180 mg/dL.   WHAT DO I DO ABOUT MY DIABETES MEDICATION?  Marland Kitchen Do not take oral diabetes medicines (pills) the morning of surgery.  . THE DAY BEFORE SURGERY, take your usual dose of Synjardy and 20 units of Soliqua insulin.       [place] - Preparing for Surgery Before surgery, you can play an important role.  Because skin is not sterile, your skin needs to be as free of germs as possible.  You can reduce the number of germs on your skin by washing with CHG (chlorahexidine gluconate) soap before surgery.  CHG is an antiseptic cleaner which kills germs and bonds with the skin to continue killing germs even after washing. Please DO NOT use if you have  an allergy to CHG or antibacterial soaps.  If your skin becomes reddened/irritated stop using the CHG and inform your nurse when you arrive at Short Stay. Do not shave (including legs and underarms) for at least 48 hours prior to the first CHG shower.  You may shave your face/neck. Please follow these instructions carefully:  1.  Shower with CHG Soap the night before surgery and the  morning of Surgery.  2.  If you choose to wash your hair, wash your hair first as usual with your  normal  shampoo.  3.  After you shampoo, rinse your hair and body thoroughly to remove the  shampoo.                           4.  Use CHG as you would any other liquid soap.  You can apply chg directly  to the skin and wash                       Gently with a scrungie or clean washcloth.  5.  Apply the CHG Soap to your body ONLY FROM THE NECK DOWN.   Do not use on face/ open                           Wound or open sores. Avoid contact with eyes, ears mouth and genitals (private parts).                       Wash face,  Genitals (private parts) with your normal soap.             6.  Wash thoroughly, paying special attention to the area where your surgery  will be performed.  7.  Thoroughly rinse your body with warm water from the neck down.  8.  DO NOT shower/wash with your normal soap after using and rinsing off  the CHG Soap.                9.  Pat yourself dry with a clean towel.            10.  Wear clean pajamas.  11.  Place clean sheets on your bed the night of your first shower and do not  sleep with pets. Day of Surgery : Do not apply any lotions/deodorants the morning of surgery.  Please wear clean clothes to the hospital/surgery center.  FAILURE TO FOLLOW THESE INSTRUCTIONS MAY RESULT IN THE CANCELLATION OF YOUR SURGERY PATIENT SIGNATURE_________________________________  NURSE  SIGNATURE__________________________________  ________________________________________________________________________   Adam Phenix  An incentive spirometer is a tool that can help keep your lungs clear and active. This tool measures how well you are filling your lungs with each breath. Taking long deep breaths may help reverse or decrease the chance of developing breathing (pulmonary) problems (especially infection) following:  A long period of time when you are unable to move or be active. BEFORE THE PROCEDURE   If the spirometer includes an indicator to show your [place] effort, your nurse or respiratory therapist will set it to a desired goal.  If possible, sit up straight or lean slightly forward. Try not to slouch.  Hold the incentive spirometer in an upright position. INSTRUCTIONS FOR USE  1. Sit on the edge of your bed if possible, or sit up as far as you can in bed or on a chair. 2. Hold the incentive spirometer in an upright position. 3. Breathe out normally. 4. Place the mouthpiece in your mouth and seal your lips tightly around it. 5. Breathe in slowly and as deeply as possible, raising the piston or the ball toward the top of the column. 6. Hold your breath for 3-5 seconds or for as long as possible. Allow the piston or ball to fall to the bottom of the column. 7. Remove the mouthpiece from your mouth and breathe out normally. 8. Rest for a few seconds and repeat Steps 1 through 7 at least 10 times every 1-2 hours when you are awake. Take your time and take a few normal breaths between deep breaths. 9. The spirometer may include an indicator to show your [place] effort. Use the indicator as a goal to work toward during each repetition. 10. After each set of 10 deep breaths, practice coughing to be sure your lungs are clear. If you have an incision (the cut made at the time of surgery), support your incision when coughing by placing a pillow or rolled up towels firmly  against it. Once you are able to get out of bed, walk around indoors and cough well. You may stop using the incentive spirometer when instructed by your caregiver.  RISKS AND COMPLICATIONS  Take your time so you do not get dizzy or light-headed.  If you are in pain, you may need to take or ask for pain medication before doing incentive spirometry. It is harder to take a deep breath if you are having pain. AFTER USE  Rest and breathe slowly and easily.  It can be helpful to keep track of a log of your progress. Your caregiver can provide you with a simple table to help with this. If you are using the spirometer at home, follow these instructions: [place] IF:   You are having difficultly using the spirometer.  You have trouble using the spirometer as often as instructed.  Your pain medication is not giving enough relief while using the spirometer.  You develop fever of 100.5 F (38.1 C) or higher. SEEK IMMEDIATE MEDICAL CARE IF:   You cough up bloody sputum that had not been present before.  You develop fever of 102 F (38.9 C)  or greater.  You develop worsening pain at or near the incision site. MAKE SURE YOU:   Understand these instructions.  Will watch your condition.  Will get help right away if you are not doing well or get worse. Document Released: 01/15/2007 Document Revised: 11/27/2011 Document Reviewed: 03/18/2007 Exit[employer] Patient Information 2014 Exit[employer], [place].   ________________________________________________________________________  WHAT IS A BLOOD TRANSFUSION? Blood Transfusion Information  A transfusion is the replacement of blood or some of its parts. Blood is made up of multiple cells which provide different functions.  Red blood cells carry oxygen and are used for blood loss replacement.  White blood cells fight against infection.  Platelets control bleeding.  Plasma helps clot blood.  Other blood products are available for  specialized needs, such as hemophilia or other clotting disorders. BEFORE THE TRANSFUSION  Who gives blood for transfusions?   Healthy volunteers who are fully evaluated to make sure their blood is safe. This is blood bank blood. Transfusion therapy is the safest it has ever been in the practice of medicine. Before blood is taken from a donor, a complete history is taken to make sure that person has no history of diseases nor engages in risky social behavior (examples are intravenous drug use or sexual activity with multiple partners). The donor's travel history is screened to minimize risk of transmitting infections, such as malaria. The donated blood is tested for signs of infectious diseases, such as HIV and hepatitis. The blood is then tested to be sure it is compatible with you in order to minimize the chance of a transfusion reaction. If you or a relative donates blood, this is often done in anticipation of surgery and is not appropriate for emergency situations. It takes many days to process the donated blood. RISKS AND COMPLICATIONS Although transfusion therapy is very safe and saves many lives, the main dangers of transfusion include:   Getting an infectious disease.  Developing a transfusion reaction. This is an allergic reaction to something in the blood you were given. Every precaution is taken to prevent this. The decision to have a blood transfusion has been considered carefully by your caregiver before blood is given. Blood is not given unless the benefits outweigh the risks. AFTER THE TRANSFUSION  Right after receiving a blood transfusion, you will usually feel much better and more energetic. This is especially true if your red blood cells have gotten low (anemic). The transfusion raises the level of the red blood cells which carry oxygen, and this usually causes an energy increase.  The nurse administering the transfusion will monitor you carefully for complications. HOME CARE  INSTRUCTIONS  No special instructions are needed after a transfusion. You may find your energy is better. Speak with your caregiver about any limitations on activity for underlying diseases you may have. SEEK MEDICAL CARE IF:   Your condition is not improving after your transfusion.  You develop redness or irritation at the intravenous (IV) site. SEEK IMMEDIATE MEDICAL CARE IF:  Any of the following symptoms occur over the next 12 hours:  Shaking chills.  You have a temperature by mouth above 102 F (38.9 C), not controlled by medicine.  Chest, back, or muscle pain.  People around you feel you are not acting correctly or are confused.  Shortness of breath or difficulty breathing.  Dizziness and fainting.  You get a rash or develop hives.  You have a decrease in urine output.  Your urine turns a dark color or changes to pink, red,  or brown. Any of the following symptoms occur over the next 10 days:  You have a temperature by mouth above 102 F (38.9 C), not controlled by medicine.  Shortness of breath.  Weakness after normal activity.  The white part of the eye turns yellow (jaundice).  You have a decrease in the amount of urine or are urinating less often.  Your urine turns a dark color or changes to pink, red, or brown. Document Released: 09/01/2000 Document Revised: 11/27/2011 Document Reviewed: 04/20/2008 [facility] Patient Information 2014 [place], [place].  _______________________________________________________________________

## 2018-08-13 ENCOUNTER — Encounter (HOSPITAL_COMMUNITY): Payer: Self-pay | Admitting: *Deleted

## 2018-08-13 ENCOUNTER — Other Ambulatory Visit: Payer: Self-pay

## 2018-08-13 ENCOUNTER — Encounter (HOSPITAL_COMMUNITY)
Admission: RE | Admit: 2018-08-13 | Discharge: 2018-08-13 | Disposition: A | Discharge status: Home / Self Care | Payer: 59 | Source: Ambulatory Visit | Attending: Orthopedic Surgery | Admitting: Orthopedic Surgery

## 2018-08-13 DIAGNOSIS — Z01812 Encounter for preprocedural laboratory examination: Secondary | ICD-10-CM | POA: Diagnosis not present

## 2018-08-13 LAB — COMPREHENSIVE METABOLIC PANEL
ALBUMIN: 4.4 g/dL (ref 3.5–5.0)
ALT: 35 U/L (ref 0–44)
ANION GAP: 9 (ref 5–15)
AST: 27 U/L (ref 15–41)
Alkaline Phosphatase: 95 U/L (ref 38–126)
BUN: 18 mg/dL (ref 6–20)
CHLORIDE: 106 mmol/L (ref 98–111)
CO2: 26 mmol/L (ref 22–32)
Calcium: 9.7 mg/dL (ref 8.9–10.3)
Creatinine, Ser: 0.46 mg/dL (ref 0.44–1.00)
GFR calc Af Amer: >60 mL/min (ref >60)
GFR calc non Af Amer: >60 mL/min (ref >60)
GLUCOSE: 166 mg/dL — AB (ref 70–99)
POTASSIUM: 4.9 mmol/L (ref 3.5–5.1)
Sodium: 141 mmol/L (ref 135–145)
Total Bilirubin: 0.8 mg/dL (ref 0.3–1.2)
Total Protein: 7.5 g/dL (ref 6.5–8.1)

## 2018-08-13 LAB — CBC
HEMATOCRIT: 45.4 % (ref 36.0–46.0)
HEMOGLOBIN: 14 g/dL (ref 12.0–15.0)
MCH: 26.8 pg (ref 26.0–34.0)
MCHC: 30.8 g/dL (ref 30.0–36.0)
MCV: 86.8 fL (ref 80.0–100.0)
Platelets: 190 10*3/uL (ref 150–400)
RBC: 5.23 MIL/uL — ABNORMAL HIGH (ref 3.87–5.11)
RDW: 13.8 % (ref 11.5–15.5)
WBC: 6 10*3/uL (ref 4.0–10.5)
nRBC: 0 % (ref 0.0–0.2)

## 2018-08-13 LAB — GLUCOSE, CAPILLARY: Glucose-Capillary: 173 mg/dL — ABNORMAL HIGH (ref 70–99)

## 2018-08-13 LAB — PREGNANCY, URINE: PREG TEST UR: NEGATIVE

## 2018-08-13 LAB — PROTIME-INR
INR: 0.76
Prothrombin Time: 10.6 seconds — ABNORMAL LOW (ref 11.4–15.2)

## 2018-08-13 LAB — HEMOGLOBIN A1C
Hgb A1c MFr Bld: 7.2 % — ABNORMAL HIGH (ref 4.8–5.6)
Mean Plasma Glucose: 159.94 mg/dL

## 2018-08-13 LAB — SURGICAL PCR SCREEN
MRSA, PCR: NEGATIVE
Staphylococcus aureus: NEGATIVE

## 2018-08-13 LAB — APTT: aPTT: 26 seconds (ref 24–36)

## 2018-08-13 NOTE — Progress Notes (Signed)
PT/INR result routed to Dr. Anne Fu office for review

## 2018-08-20 MED ORDER — BUPIVACAINE LIPOSOME 1.3 % IJ SUSP
20.0000 mL | Freq: Once | INTRAMUSCULAR | Status: DC
  Filled 2018-08-20: qty 20

## 2018-08-21 ENCOUNTER — Inpatient Hospital Stay (HOSPITAL_COMMUNITY): Payer: 59 | Admitting: Certified Registered Nurse Anesthetist

## 2018-08-21 ENCOUNTER — Encounter (HOSPITAL_COMMUNITY): Payer: Self-pay | Admitting: General Practice

## 2018-08-21 ENCOUNTER — Other Ambulatory Visit: Payer: Self-pay

## 2018-08-21 ENCOUNTER — Encounter (HOSPITAL_COMMUNITY)
Admission: RE | Disposition: A | Discharge status: Home / Self Care | Payer: Self-pay | Source: Home / Self Care | Attending: Orthopedic Surgery

## 2018-08-21 ENCOUNTER — Inpatient Hospital Stay (HOSPITAL_COMMUNITY)
Admission: RE | Admit: 2018-08-21 | Discharge: 2018-08-22 | DRG: 468 | Disposition: A | Discharge status: Home / Self Care | Payer: 59 | Attending: Orthopedic Surgery | Admitting: Orthopedic Surgery

## 2018-08-21 DIAGNOSIS — Z79899 Other long term (current) drug therapy: Secondary | ICD-10-CM | POA: Diagnosis not present

## 2018-08-21 DIAGNOSIS — E119 Type 2 diabetes mellitus without complications: Secondary | ICD-10-CM | POA: Diagnosis present

## 2018-08-21 DIAGNOSIS — Z85828 Personal history of other malignant neoplasm of skin: Secondary | ICD-10-CM

## 2018-08-21 DIAGNOSIS — T84018A Broken internal joint prosthesis, other site, initial encounter: Secondary | ICD-10-CM

## 2018-08-21 DIAGNOSIS — Z833 Family history of diabetes mellitus: Secondary | ICD-10-CM | POA: Diagnosis not present

## 2018-08-21 DIAGNOSIS — I10 Essential (primary) hypertension: Secondary | ICD-10-CM | POA: Diagnosis not present

## 2018-08-21 DIAGNOSIS — Z7901 Long term (current) use of anticoagulants: Secondary | ICD-10-CM

## 2018-08-21 DIAGNOSIS — T84092A Other mechanical complication of internal right knee prosthesis, initial encounter: Secondary | ICD-10-CM | POA: Diagnosis not present

## 2018-08-21 DIAGNOSIS — Z87891 Personal history of nicotine dependence: Secondary | ICD-10-CM | POA: Diagnosis not present

## 2018-08-21 DIAGNOSIS — G8918 Other acute postprocedural pain: Secondary | ICD-10-CM | POA: Diagnosis not present

## 2018-08-21 DIAGNOSIS — Z96659 Presence of unspecified artificial knee joint: Secondary | ICD-10-CM

## 2018-08-21 DIAGNOSIS — E785 Hyperlipidemia, unspecified: Secondary | ICD-10-CM | POA: Diagnosis present

## 2018-08-21 DIAGNOSIS — F410 Panic disorder [episodic paroxysmal anxiety] without agoraphobia: Secondary | ICD-10-CM | POA: Diagnosis present

## 2018-08-21 DIAGNOSIS — M25561 Pain in right knee: Secondary | ICD-10-CM | POA: Diagnosis not present

## 2018-08-21 DIAGNOSIS — Y792 Prosthetic and other implants, materials and accessory orthopedic devices associated with adverse incidents: Secondary | ICD-10-CM | POA: Diagnosis present

## 2018-08-21 DIAGNOSIS — M1712 Unilateral primary osteoarthritis, left knee: Secondary | ICD-10-CM | POA: Diagnosis not present

## 2018-08-21 DIAGNOSIS — Z7984 Long term (current) use of oral hypoglycemic drugs: Secondary | ICD-10-CM

## 2018-08-21 HISTORY — PX: TOTAL KNEE REVISION: SHX996

## 2018-08-21 LAB — GLUCOSE, CAPILLARY
GLUCOSE-CAPILLARY: 244 mg/dL — AB (ref 70–99)
Glucose-Capillary: 176 mg/dL — ABNORMAL HIGH (ref 70–99)
Glucose-Capillary: 144 mg/dL — ABNORMAL HIGH (ref 70–99)
Glucose-Capillary: 164 mg/dL — ABNORMAL HIGH (ref 70–99)

## 2018-08-21 LAB — TYPE AND SCREEN
ABO/RH(D): A POS
ANTIBODY SCREEN: NEGATIVE

## 2018-08-21 SURGERY — TOTAL KNEE REVISION
Anesthesia: Spinal | Site: Knee | Laterality: Right

## 2018-08-21 MED ORDER — TRANEXAMIC ACID-NACL 1000-0.7 MG/100ML-% IV SOLN
1000.0000 mg | INTRAVENOUS | Status: AC
  Administered 2018-08-21: 1000 mg via INTRAVENOUS
  Filled 2018-08-21: qty 100

## 2018-08-21 MED ORDER — TRANEXAMIC ACID-NACL 1000-0.7 MG/100ML-% IV SOLN
1000.0000 mg | Freq: Once | INTRAVENOUS | Status: AC
  Administered 2018-08-21: 1000 mg via INTRAVENOUS
  Filled 2018-08-21: qty 100

## 2018-08-21 MED ORDER — POLYETHYLENE GLYCOL 3350 17 G PO PACK: 17.0000 g | PACK | Freq: Every day | ORAL | Status: DC | PRN

## 2018-08-21 MED ORDER — MEPERIDINE HCL 50 MG/ML IJ SOLN: 6.2500 mg | INTRAMUSCULAR | Status: DC | PRN

## 2018-08-21 MED ORDER — MENTHOL 3 MG MT LOZG: 1.0000 | LOZENGE | OROMUCOSAL | Status: DC | PRN

## 2018-08-21 MED ORDER — FENTANYL CITRATE (PF) 100 MCG/2ML IJ SOLN
50.0000 ug | INTRAMUSCULAR | Status: AC
  Administered 2018-08-21: 100 ug via INTRAVENOUS
  Filled 2018-08-21: qty 2

## 2018-08-21 MED ORDER — SODIUM CHLORIDE 0.9 % IV SOLN
INTRAVENOUS | Status: AC | PRN
  Administered 2018-08-21: 1000 mL

## 2018-08-21 MED ORDER — STERILE WATER FOR IRRIGATION IR SOLN
Status: DC | PRN
  Administered 2018-08-21: 2000 mL

## 2018-08-21 MED ORDER — PHENOL 1.4 % MT LIQD
1.0000 | OROMUCOSAL | Status: DC | PRN
  Filled 2018-08-21: qty 177

## 2018-08-21 MED ORDER — ACETAMINOPHEN 10 MG/ML IV SOLN
1000.0000 mg | Freq: Four times a day (QID) | INTRAVENOUS | Status: DC
  Administered 2018-08-21: 1000 mg via INTRAVENOUS
  Filled 2018-08-21: qty 100

## 2018-08-21 MED ORDER — METHOCARBAMOL 500 MG IVPB - SIMPLE MED
INTRAVENOUS | Status: AC
  Filled 2018-08-21: qty 50

## 2018-08-21 MED ORDER — LACTATED RINGERS IV SOLN
INTRAVENOUS | Status: DC
  Administered 2018-08-21 (×2): via INTRAVENOUS

## 2018-08-21 MED ORDER — PROMETHAZINE HCL 25 MG/ML IJ SOLN: 6.2500 mg | INTRAMUSCULAR | Status: DC | PRN

## 2018-08-21 MED ORDER — CLONAZEPAM 1 MG PO TABS
1.0000 mg | ORAL_TABLET | Freq: Three times a day (TID) | ORAL | Status: DC | PRN
  Administered 2018-08-21: 1 mg via ORAL
  Filled 2018-08-21: qty 1

## 2018-08-21 MED ORDER — METHOCARBAMOL 500 MG IVPB - SIMPLE MED
500.0000 mg | Freq: Four times a day (QID) | INTRAVENOUS | Status: DC | PRN
  Administered 2018-08-21: 500 mg via INTRAVENOUS
  Filled 2018-08-21: qty 50

## 2018-08-21 MED ORDER — CHLORHEXIDINE GLUCONATE 4 % EX LIQD: 60.0000 mL | Freq: Once | CUTANEOUS | Status: DC

## 2018-08-21 MED ORDER — BUPIVACAINE IN DEXTROSE 0.75-8.25 % IT SOLN
INTRATHECAL | Status: DC | PRN
  Administered 2018-08-21: 1.6 mL via INTRATHECAL

## 2018-08-21 MED ORDER — METHYLPREDNISOLONE ACETATE 40 MG/ML IJ SUSP
INTRAMUSCULAR | Status: AC
  Filled 2018-08-21: qty 2

## 2018-08-21 MED ORDER — BISACODYL 10 MG RE SUPP: 10.0000 mg | Freq: Every day | RECTAL | Status: DC | PRN

## 2018-08-21 MED ORDER — METOCLOPRAMIDE HCL 5 MG/ML IJ SOLN: 5.0000 mg | Freq: Three times a day (TID) | INTRAMUSCULAR | Status: DC | PRN

## 2018-08-21 MED ORDER — ONDANSETRON HCL 4 MG PO TABS: 4.0000 mg | ORAL_TABLET | Freq: Four times a day (QID) | ORAL | Status: DC | PRN

## 2018-08-21 MED ORDER — CEFAZOLIN SODIUM-DEXTROSE 2-4 GM/100ML-% IV SOLN
2.0000 g | Freq: Four times a day (QID) | INTRAVENOUS | Status: AC
  Administered 2018-08-21 (×2): 2 g via INTRAVENOUS
  Filled 2018-08-21 (×2): qty 100

## 2018-08-21 MED ORDER — BUPIVACAINE LIPOSOME 1.3 % IJ SUSP
INTRAMUSCULAR | Status: DC | PRN
  Administered 2018-08-21: 20 mL

## 2018-08-21 MED ORDER — FENTANYL CITRATE (PF) 100 MCG/2ML IJ SOLN
50.0000 ug | INTRAMUSCULAR | Status: DC
  Administered 2018-08-21: 100 ug via INTRAVENOUS

## 2018-08-21 MED ORDER — ROPIVACAINE HCL 7.5 MG/ML IJ SOLN
INTRAMUSCULAR | Status: DC | PRN
  Administered 2018-08-21 (×6): 5 mL via PERINEURAL

## 2018-08-21 MED ORDER — METFORMIN HCL 500 MG PO TABS
500.0000 mg | ORAL_TABLET | Freq: Two times a day (BID) | ORAL | Status: DC
  Administered 2018-08-22: 500 mg via ORAL
  Filled 2018-08-21: qty 1

## 2018-08-21 MED ORDER — OXYCODONE HCL 5 MG PO TABS
10.0000 mg | ORAL_TABLET | ORAL | Status: DC | PRN
  Administered 2018-08-22 (×2): 10 mg via ORAL
  Filled 2018-08-21 (×2): qty 2

## 2018-08-21 MED ORDER — METHOCARBAMOL 500 MG PO TABS
500.0000 mg | ORAL_TABLET | Freq: Four times a day (QID) | ORAL | Status: DC | PRN
  Administered 2018-08-21 – 2018-08-22 (×2): 500 mg via ORAL
  Filled 2018-08-21 (×2): qty 1

## 2018-08-21 MED ORDER — MORPHINE SULFATE (PF) 2 MG/ML IV SOLN: 1.0000 mg | INTRAVENOUS | Status: DC | PRN

## 2018-08-21 MED ORDER — DIPHENHYDRAMINE HCL 12.5 MG/5ML PO ELIX: 12.5000 mg | ORAL_SOLUTION | ORAL | Status: DC | PRN

## 2018-08-21 MED ORDER — SERTRALINE HCL 100 MG PO TABS
100.0000 mg | ORAL_TABLET | Freq: Every day | ORAL | Status: DC
  Administered 2018-08-21: 100 mg via ORAL
  Filled 2018-08-21: qty 1

## 2018-08-21 MED ORDER — DEXAMETHASONE SODIUM PHOSPHATE 10 MG/ML IJ SOLN
8.0000 mg | Freq: Once | INTRAMUSCULAR | Status: AC
  Administered 2018-08-21: 8 mg via INTRAVENOUS

## 2018-08-21 MED ORDER — INSULIN ASPART 100 UNIT/ML ~~LOC~~ SOLN
0.0000 [IU] | Freq: Three times a day (TID) | SUBCUTANEOUS | Status: DC
  Administered 2018-08-21 – 2018-08-22 (×2): 5 [IU] via SUBCUTANEOUS
  Administered 2018-08-22: 3 [IU] via SUBCUTANEOUS

## 2018-08-21 MED ORDER — FLEET ENEMA 7-19 GM/118ML RE ENEM: 1.0000 | ENEMA | Freq: Once | RECTAL | Status: DC | PRN

## 2018-08-21 MED ORDER — ASPIRIN EC 325 MG PO TBEC
325.0000 mg | DELAYED_RELEASE_TABLET | Freq: Two times a day (BID) | ORAL | Status: DC
  Administered 2018-08-22: 325 mg via ORAL
  Filled 2018-08-21: qty 1

## 2018-08-21 MED ORDER — ONDANSETRON HCL 4 MG/2ML IJ SOLN: 4.0000 mg | Freq: Four times a day (QID) | INTRAMUSCULAR | Status: DC | PRN

## 2018-08-21 MED ORDER — MIDAZOLAM HCL 2 MG/2ML IJ SOLN
INTRAMUSCULAR | Status: AC
  Administered 2018-08-21: 2 mg via INTRAVENOUS
  Filled 2018-08-21: qty 2

## 2018-08-21 MED ORDER — METFORMIN HCL 500 MG PO TABS: 500.0000 mg | ORAL_TABLET | Freq: Two times a day (BID) | ORAL | Status: DC

## 2018-08-21 MED ORDER — MIDAZOLAM HCL 2 MG/2ML IJ SOLN
1.0000 mg | INTRAMUSCULAR | Status: AC
  Administered 2018-08-21: 2 mg via INTRAVENOUS
  Filled 2018-08-21: qty 2

## 2018-08-21 MED ORDER — INSULIN GLARGINE-LIXISENATIDE 100-33 UNT-MCG/ML ~~LOC~~ SOPN: 20.0000 [IU] | PEN_INJECTOR | Freq: Every day | SUBCUTANEOUS | Status: DC

## 2018-08-21 MED ORDER — 0.9 % SODIUM CHLORIDE (POUR BTL) OPTIME
TOPICAL | Status: DC | PRN
  Administered 2018-08-21: 1000 mL

## 2018-08-21 MED ORDER — GABAPENTIN 300 MG PO CAPS
300.0000 mg | ORAL_CAPSULE | Freq: Once | ORAL | Status: AC
  Administered 2018-08-21: 300 mg via ORAL
  Filled 2018-08-21: qty 1

## 2018-08-21 MED ORDER — HYDROMORPHONE HCL 1 MG/ML IJ SOLN: 0.2500 mg | INTRAMUSCULAR | Status: DC | PRN

## 2018-08-21 MED ORDER — FENTANYL CITRATE (PF) 100 MCG/2ML IJ SOLN
INTRAMUSCULAR | Status: AC
  Filled 2018-08-21: qty 2

## 2018-08-21 MED ORDER — CEFAZOLIN SODIUM-DEXTROSE 2-4 GM/100ML-% IV SOLN
2.0000 g | INTRAVENOUS | Status: AC
  Administered 2018-08-21: 2 g via INTRAVENOUS
  Filled 2018-08-21: qty 100

## 2018-08-21 MED ORDER — PROPOFOL 10 MG/ML IV BOLUS
INTRAVENOUS | Status: DC | PRN
  Administered 2018-08-21: 20 mg via INTRAVENOUS
  Administered 2018-08-21 (×3): 10 mg via INTRAVENOUS

## 2018-08-21 MED ORDER — EMPAGLIFLOZIN-METFORMIN HCL 12.5-500 MG PO TABS: 1.0000 | ORAL_TABLET | Freq: Two times a day (BID) | ORAL | Status: DC

## 2018-08-21 MED ORDER — OXYCODONE HCL 5 MG PO TABS
5.0000 mg | ORAL_TABLET | ORAL | Status: DC | PRN
  Administered 2018-08-21 (×2): 5 mg via ORAL
  Filled 2018-08-21 (×2): qty 1

## 2018-08-21 MED ORDER — CANAGLIFLOZIN 100 MG PO TABS
100.0000 mg | ORAL_TABLET | Freq: Every day | ORAL | Status: DC
  Administered 2018-08-22: 100 mg via ORAL
  Filled 2018-08-21: qty 1

## 2018-08-21 MED ORDER — SODIUM CHLORIDE 0.9 % IV SOLN
INTRAVENOUS | Status: DC
  Administered 2018-08-21 – 2018-08-22 (×2): via INTRAVENOUS

## 2018-08-21 MED ORDER — SODIUM CHLORIDE (PF) 0.9 % IJ SOLN
INTRAMUSCULAR | Status: AC
  Filled 2018-08-21: qty 10

## 2018-08-21 MED ORDER — DOCUSATE SODIUM 100 MG PO CAPS
100.0000 mg | ORAL_CAPSULE | Freq: Two times a day (BID) | ORAL | Status: DC
  Administered 2018-08-21 – 2018-08-22 (×2): 100 mg via ORAL
  Filled 2018-08-21 (×2): qty 1

## 2018-08-21 MED ORDER — CANAGLIFLOZIN 100 MG PO TABS: 100.0000 mg | ORAL_TABLET | Freq: Every day | ORAL | Status: DC

## 2018-08-21 MED ORDER — KETOROLAC TROMETHAMINE 30 MG/ML IJ SOLN: 30.0000 mg | Freq: Once | INTRAMUSCULAR | Status: DC | PRN

## 2018-08-21 MED ORDER — SODIUM CHLORIDE (PF) 0.9 % IJ SOLN
INTRAMUSCULAR | Status: AC
  Filled 2018-08-21: qty 50

## 2018-08-21 MED ORDER — MIDAZOLAM HCL 2 MG/2ML IJ SOLN
1.0000 mg | INTRAMUSCULAR | Status: DC
  Administered 2018-08-21: 2 mg via INTRAVENOUS

## 2018-08-21 MED ORDER — SODIUM CHLORIDE (PF) 0.9 % IJ SOLN
INTRAMUSCULAR | Status: DC | PRN
  Administered 2018-08-21: 60 mL

## 2018-08-21 MED ORDER — ATORVASTATIN CALCIUM 40 MG PO TABS
80.0000 mg | ORAL_TABLET | Freq: Every day | ORAL | Status: DC
  Administered 2018-08-22: 80 mg via ORAL
  Filled 2018-08-21 (×2): qty 2

## 2018-08-21 MED ORDER — ONDANSETRON HCL 4 MG/2ML IJ SOLN
INTRAMUSCULAR | Status: DC | PRN
  Administered 2018-08-21: 4 mg via INTRAVENOUS

## 2018-08-21 MED ORDER — METOCLOPRAMIDE HCL 5 MG PO TABS: 5.0000 mg | ORAL_TABLET | Freq: Three times a day (TID) | ORAL | Status: DC | PRN

## 2018-08-21 MED ORDER — PROPOFOL 500 MG/50ML IV EMUL
INTRAVENOUS | Status: DC | PRN
  Administered 2018-08-21: 50 ug/kg/min via INTRAVENOUS

## 2018-08-21 MED ORDER — ACETAMINOPHEN 500 MG PO TABS
1000.0000 mg | ORAL_TABLET | Freq: Four times a day (QID) | ORAL | Status: AC
  Administered 2018-08-21 – 2018-08-22 (×4): 1000 mg via ORAL
  Filled 2018-08-21 (×4): qty 2

## 2018-08-21 MED ORDER — GABAPENTIN 300 MG PO CAPS
300.0000 mg | ORAL_CAPSULE | Freq: Three times a day (TID) | ORAL | Status: DC
  Administered 2018-08-21 – 2018-08-22 (×3): 300 mg via ORAL
  Filled 2018-08-21 (×3): qty 1

## 2018-08-21 MED ORDER — DEXAMETHASONE SODIUM PHOSPHATE 10 MG/ML IJ SOLN
10.0000 mg | Freq: Once | INTRAMUSCULAR | Status: AC
  Administered 2018-08-22: 10 mg via INTRAVENOUS
  Filled 2018-08-21: qty 1

## 2018-08-21 SURGICAL SUPPLY — 66 items
ATTUNE PSRP INSR SZ6 12 KNEE (Insert) ×2 IMPLANT
ATTUNE PSRP INSR SZ6 12MM KNEE (Insert) ×1 IMPLANT
AUG TIB ATTUNE UNV SZ5 6X5 (Joint) ×6 IMPLANT
AUGMENT TIB ATTUNE UNV SZ5 6X5 (Joint) ×2 IMPLANT
BAG DECANTER FOR FLEXI CONT (MISCELLANEOUS) IMPLANT
BAG ZIPLOCK 12X15 (MISCELLANEOUS) IMPLANT
BANDAGE ACE 6X5 VEL STRL LF (GAUZE/BANDAGES/DRESSINGS) ×3 IMPLANT
BLADE SAG 18X100X1.27 (BLADE) IMPLANT
BLADE SAW SGTL 11.0X1.19X90.0M (BLADE) IMPLANT
BNDG ELASTIC 6X15 VLCR STRL LF (GAUZE/BANDAGES/DRESSINGS) ×3 IMPLANT
BONE CEMENT GENTAMICIN (Cement) ×6 IMPLANT
CEMENT BONE GENTAMICIN 40 (Cement) ×2 IMPLANT
CEMENT RESTRICTOR DEPUY SZ 4 (Cement) ×3 IMPLANT
CLOSURE WOUND 1/2 X4 (GAUZE/BANDAGES/DRESSINGS) ×1
CLOTH BEACON ORANGE TIMEOUT ST (SAFETY) IMPLANT
COVER SURGICAL LIGHT HANDLE (MISCELLANEOUS) ×3 IMPLANT
COVER WAND RF STERILE (DRAPES) ×3 IMPLANT
CUFF TOURN SGL QUICK 34 (TOURNIQUET CUFF) ×2
CUFF TRNQT CYL 34X4X40X1 (TOURNIQUET CUFF) ×1 IMPLANT
DECANTER SPIKE VIAL GLASS SM (MISCELLANEOUS) ×6 IMPLANT
DRAPE U-SHAPE 47X51 STRL (DRAPES) ×3 IMPLANT
DRSG ADAPTIC 3X8 NADH LF (GAUZE/BANDAGES/DRESSINGS) ×3 IMPLANT
DRSG EMULSION OIL 3X16 NADH (GAUZE/BANDAGES/DRESSINGS) ×3 IMPLANT
DRSG PAD ABDOMINAL 8X10 ST (GAUZE/BANDAGES/DRESSINGS) ×3 IMPLANT
DURAPREP 26ML APPLICATOR (WOUND CARE) ×3 IMPLANT
ELECT REM PT RETURN 15FT ADLT (MISCELLANEOUS) ×3 IMPLANT
EVACUATOR 1/8 PVC DRAIN (DRAIN) ×3 IMPLANT
GAUZE SPONGE 4X4 12PLY STRL (GAUZE/BANDAGES/DRESSINGS) ×3 IMPLANT
GLOVE BIO SURGEON STRL SZ7 (GLOVE) ×6 IMPLANT
GLOVE BIO SURGEON STRL SZ8 (GLOVE) ×3 IMPLANT
GLOVE BIOGEL PI IND STRL 7.0 (GLOVE) ×2 IMPLANT
GLOVE BIOGEL PI IND STRL 8 (GLOVE) ×1 IMPLANT
GLOVE BIOGEL PI INDICATOR 7.0 (GLOVE) ×4
GLOVE BIOGEL PI INDICATOR 8 (GLOVE) ×2
GOWN STRL REUS W/TWL LRG LVL3 (GOWN DISPOSABLE) ×3 IMPLANT
GOWN STRL REUS W/TWL XL LVL3 (GOWN DISPOSABLE) ×3 IMPLANT
HANDPIECE INTERPULSE COAX TIP (DISPOSABLE) ×2
HOLDER FOLEY CATH W/STRAP (MISCELLANEOUS) ×3 IMPLANT
IMMOBILIZER KNEE 20 (SOFTGOODS) ×3
IMMOBILIZER KNEE 20 THIGH 36 (SOFTGOODS) ×1 IMPLANT
MANIFOLD NEPTUNE II (INSTRUMENTS) ×3 IMPLANT
NS IRRIG 1000ML POUR BTL (IV SOLUTION) ×3 IMPLANT
PACK TOTAL KNEE CUSTOM (KITS) ×3 IMPLANT
PADDING CAST COTTON 6X4 STRL (CAST SUPPLIES) ×3 IMPLANT
PLATE REV TIB BAS ROT SZ5 KNEE (Orthopedic Implant) ×1 IMPLANT
PROTECTOR NERVE ULNAR (MISCELLANEOUS) ×3 IMPLANT
REV ATTUNE STEM 14X30 KNEE (Orthopedic Implant) ×3 IMPLANT
REV TIB BASE ROT PLAT SZ5 KNEE (Orthopedic Implant) ×3 IMPLANT
SET HNDPC FAN SPRY TIP SCT (DISPOSABLE) ×1 IMPLANT
SLEEVE KNEE ATTUNE 29MM (Knees) ×3 IMPLANT
STEM REV ATTUNE 14X30 KNEE (Orthopedic Implant) ×1 IMPLANT
STRIP CLOSURE SKIN 1/2X4 (GAUZE/BANDAGES/DRESSINGS) ×2 IMPLANT
SUT MNCRL AB 4-0 PS2 18 (SUTURE) ×3 IMPLANT
SUT STRATAFIX 0 PDS 27 VIOLET (SUTURE) ×3
SUT VIC AB 2-0 CT1 27 (SUTURE) ×6
SUT VIC AB 2-0 CT1 TAPERPNT 27 (SUTURE) ×3 IMPLANT
SUTURE STRATFX 0 PDS 27 VIOLET (SUTURE) ×1 IMPLANT
SWAB COLLECTION DEVICE MRSA (MISCELLANEOUS) IMPLANT
SWAB CULTURE ESWAB REG 1ML (MISCELLANEOUS) IMPLANT
SYR 50ML LL SCALE MARK (SYRINGE) ×6 IMPLANT
TOWER CARTRIDGE SMART MIX (DISPOSABLE) ×3 IMPLANT
TRAY FOLEY CATH 14FRSI W/METER (CATHETERS) ×3 IMPLANT
TRAY FOLEY MTR SLVR 16FR STAT (SET/KITS/TRAYS/PACK) IMPLANT
TUBE KAMVAC SUCTION (TUBING) IMPLANT
WATER STERILE IRR 1000ML POUR (IV SOLUTION) ×6 IMPLANT
WRAP KNEE MAXI GEL POST OP (GAUZE/BANDAGES/DRESSINGS) ×3 IMPLANT

## 2018-08-21 NOTE — Brief Op Note (Addendum)
08/21/2018  82:70 AM  PATIENT:  Terri [name]  47 y.o. female  PRE-OPERATIVE DIAGNOSIS: 1. unstable right total knee arthroplasty    2. Osteoarthritis left knee  POST-OPERATIVE DIAGNOSIS: 1. unstable right total knee arthroplasty    2. Osteoarthritis left knee  PROCEDURE:  Procedure(s) with comments: Right total knee arthroplasty revision (Right) - 142min with abductor block  2. Left knee cortisone injection  SURGEON:  Surgeon(s) and Role:    Gaynelle Arabian, MD - Primary  PHYSICIAN ASSISTANT:   ASSISTANTS: Theresa Duty, PA-C   ANESTHESIA:   Adductor canal block and spinal  EBL:  50 mL   BLOOD ADMINISTERED:none  DRAINS: (Medium) Hemovact drain(s) in the right knee with  Suction Open   LOCAL MEDICATIONS USED:  OTHER Exparel  COUNTS:  YES  TOURNIQUET:   Total Tourniquet Time Documented: Thigh (Right) - 56 minutes Total: Thigh (Right) - 56 minutes   DICTATION: .Other Dictation: Dictation Number [id number]  PLAN OF CARE: Admit to inpatient   PATIENT DISPOSITION:  PACU - hemodynamically stable.

## 2018-08-21 NOTE — Interval H&P Note (Signed)
History and Physical Interval Note:  11/0/0349 6:11 AM  Terri [name]  has presented today for surgery, with the diagnosis of unstable right total knee arthroplasty  The various methods of treatment have been discussed with the patient and family. After consideration of risks, benefits and other options for treatment, the patient has consented to  Procedure(s) with comments: Right knee polyethylene vs total knee arthroplasty revision (Right) - 179min as a surgical intervention .  The patient's history has been reviewed, patient examined, no change in status, stable for surgery.  I have reviewed the patient's chart and labs.  Questions were answered to the patient's satisfaction.     Pilar Plate Shawndell Varas

## 2018-08-21 NOTE — Anesthesia Procedure Notes (Addendum)
Spinal  Patient location during procedure: OR Start time: 08/21/2018 9:45 AM End time: 08/21/2018 9:51 AM Reason for block: at surgeon's request Staffing Resident/CRNA: West Pugh, CRNA Performed: resident/CRNA  Preanesthetic Checklist Completed: patient identified, site marked, surgical consent, pre-op evaluation, timeout performed, IV checked, risks and benefits discussed and monitors and equipment checked Spinal Block Patient position: sitting Prep: DuraPrep Patient monitoring: heart rate, continuous pulse ox and blood pressure Approach: midline Location: L3-4 Injection technique: single-shot Needle Needle type: Pencan  Needle gauge: 24 G Needle length: 9 cm Assessment Sensory level: T4 Additional Notes Expiration of kit checked and confirmed. Dr Jillyn Hidden present for procedure. Patient tolerated procedure well,without complications  with noted clear CSF. Loss of motor and sensory on exam post injection.

## 2018-08-21 NOTE — Progress Notes (Signed)
Assisted Dr. Hatchett with right, ultrasound guided, adductor canal block. Side rails up, monitors on throughout procedure. See vital signs in flow sheet. Tolerated Procedure well.  

## 2018-08-21 NOTE — Discharge Instructions (Signed)
° °Dr. Frank Aluisio °Total Joint Specialist °[facility] °[address] °[place], [place] [address] °[phone] ° °TOTAL KNEE REPLACEMENT POSTOPERATIVE DIRECTIONS ° °Knee Rehabilitation, Guidelines Following Surgery  °Results after knee surgery are often greatly improved when you follow the exercise, range of motion and muscle strengthening exercises prescribed by your doctor. Safety measures are also important to protect the knee from further injury. Any time any of these exercises cause you to have increased pain or swelling in your knee joint, decrease the amount until you are comfortable again and slowly increase them. If you have problems or questions, call your caregiver or physical therapist for advice.  ° °HOME CARE INSTRUCTIONS  °• Remove items at home which could result in a fall. This includes throw rugs or furniture in walking pathways.  °· ICE to the affected knee every three hours for 30 minutes at a time and then as needed for pain and swelling.  Continue to use ice on the knee for pain and swelling from surgery. You may notice swelling that will progress down to the foot and ankle.  This is normal after surgery.  Elevate the leg when you are not up walking on it.   °· Continue to use the breathing machine which will help keep your temperature down.  It is common for your temperature to cycle up and down following surgery, especially at night when you are not up moving around and exerting yourself.  The breathing machine keeps your lungs expanded and your temperature down. °· Do not place pillow under knee, focus on keeping the knee straight while resting ° °DIET °You may resume your previous home diet once your are discharged from the hospital. ° °DRESSING / WOUND CARE / SHOWERING °You may shower 3 days after surgery, but keep the wounds dry during showering.  You may use an occlusive plastic wrap (Press'n Seal for example), NO SOAKING/SUBMERGING IN THE BATHTUB.  If the bandage  gets wet, change with a clean dry gauze.  If the incision gets wet, pat the wound dry with a clean towel. °You may start showering once you are discharged home but do not submerge the incision under water. Just pat the incision dry and apply a dry gauze dressing on daily. °Change the surgical dressing daily and reapply a dry dressing each time. ° °ACTIVITY °Walk with your walker as instructed. °Use walker as long as suggested by your caregivers. °Avoid periods of inactivity such as sitting longer than an hour when not asleep. This helps prevent blood clots.  °You may resume a sexual relationship in one month or when given the OK by your doctor.  °You may return to work once you are cleared by your doctor.  °Do not drive a car for 6 weeks or until released by you surgeon.  °Do not drive while taking narcotics. ° °WEIGHT BEARING °Weight bearing as tolerated with assist device (walker, cane, etc) as directed, use it as long as suggested by your surgeon or therapist, typically at least 4-6 weeks. ° °POSTOPERATIVE CONSTIPATION PROTOCOL °Constipation - defined medically as fewer than three stools per week and severe constipation as less than one stool per week. ° °One of the most common issues patients have following surgery is constipation.  Even if you have a regular bowel pattern at home, your normal regimen is likely to be disrupted due to multiple reasons following surgery.  Combination of anesthesia, postoperative narcotics, change in appetite and fluid intake all can affect your bowels.    In order to avoid complications following surgery, here are some recommendations in order to help you during your recovery period. ° °Colace (docusate) - Pick up an over-the-counter form of Colace or another stool softener and take twice a day as long as you are requiring postoperative pain medications.  Take with a full glass of water daily.  If you experience loose stools or diarrhea, hold the colace until you stool forms back  up.  If your symptoms do not get better within 1 week or if they get worse, check with your doctor. ° °Dulcolax (bisacodyl) - Pick up over-the-counter and take as directed by the product packaging as needed to assist with the movement of your bowels.  Take with a full glass of water.  Use this product as needed if not relieved by Colace only.  ° °MiraLax (polyethylene glycol) - Pick up over-the-counter to have on hand.  MiraLax is a solution that will increase the amount of water in your bowels to assist with bowel movements.  Take as directed and can mix with a glass of water, juice, soda, coffee, or tea.  Take if you go more than two days without a movement. °Do not use MiraLax more than once per day. Call your doctor if you are still constipated or irregular after using this medication for 7 days in a row. ° °If you continue to have problems with postoperative constipation, please contact the office for further assistance and recommendations.  If you experience "the worst abdominal pain ever" or develop nausea or vomiting, please contact the office immediatly for further recommendations for treatment. ° °ITCHING ° If you experience itching with your medications, try taking only a single pain pill, or even half a pain pill at a time.  You can also use Benadryl over the counter for itching or also to help with sleep.  ° °TED HOSE STOCKINGS °Wear the elastic stockings on both legs for three weeks following surgery during the day but you may remove then at night for sleeping. ° °MEDICATIONS °See your medication summary on the “After Visit Summary” that the nursing staff will review with you prior to discharge.  You may have some home medications which will be placed on hold until you complete the course of blood thinner medication.  It is important for you to complete the blood thinner medication as prescribed by your surgeon.  Continue your approved medications as instructed at time of discharge. ° °PRECAUTIONS °If  you experience chest pain or shortness of breath - call 911 immediately for transfer to the hospital emergency department.  °If you develop a fever greater that 101 F, purulent drainage from wound, increased redness or drainage from wound, foul odor from the wound/dressing, or calf pain - CONTACT YOUR SURGEON.   °                                                °FOLLOW-UP APPOINTMENTS °Make sure you keep all of your appointments after your operation with your surgeon and caregivers. You should call the office at the above phone number and make an appointment for approximately two weeks after the date of your surgery or on the date instructed by your surgeon outlined in the "After Visit Summary". ° ° °RANGE OF MOTION AND STRENGTHENING EXERCISES  °Rehabilitation of the knee is important following a knee injury or   an operation. After just a few days of immobilization, the muscles of the thigh which control the knee become weakened and shrink (atrophy). Knee exercises are designed to build up the tone and strength of the thigh muscles and to improve knee motion. Often times heat used for twenty to thirty minutes before working out will loosen up your tissues and help with improving the range of motion but do not use heat for the first two weeks following surgery. These exercises can be done on a training (exercise) mat, on the floor, on a table or on a bed. Use what ever works the best and is most comfortable for you Knee exercises include:  °• Leg Lifts - While your knee is still immobilized in a splint or cast, you can do straight leg raises. Lift the leg to 60 degrees, hold for 3 sec, and slowly lower the leg. Repeat 10-20 times 2-3 times daily. Perform this exercise against resistance later as your knee gets better.  °• Quad and Hamstring Sets - Tighten up the muscle on the front of the thigh (Quad) and hold for 5-10 sec. Repeat this 10-20 times hourly. Hamstring sets are done by pushing the foot backward against an  object and holding for 5-10 sec. Repeat as with quad sets.  °· Leg Slides: Lying on your back, slowly slide your foot toward your buttocks, bending your knee up off the floor (only go as far as is comfortable). Then slowly slide your foot back down until your leg is flat on the floor again. °· Angel Wings: Lying on your back spread your legs to the side as far apart as you can without causing discomfort.  °A rehabilitation program following serious knee injuries can speed recovery and prevent re-injury in the future due to weakened muscles. Contact your doctor or a physical therapist for more information on knee rehabilitation.  ° °IF YOU ARE TRANSFERRED TO A SKILLED REHAB FACILITY °If the patient is transferred to a skilled rehab facility following release from the hospital, a list of the current medications will be sent to the facility for the patient to continue.  When discharged from the skilled rehab facility, please have the facility set up the patient's Home Health Physical Therapy prior to being released. Also, the skilled facility will be responsible for providing the patient with their medications at time of release from the facility to include their pain medication, the muscle relaxants, and their blood thinner medication. If the patient is still at the rehab facility at time of the two week follow up appointment, the skilled rehab facility will also need to assist the patient in arranging follow up appointment in our office and any transportation needs. ° °MAKE SURE YOU:  °• Understand these instructions.  °• Get help right away if you are not doing well or get worse.  ° ° °Pick up stool softner and laxative for home use following surgery while on pain medications. °Do not submerge incision under water. °Please use good hand washing techniques while changing dressing each day. °May shower starting three days after surgery. °Please use a clean towel to pat the incision dry following showers. °Continue to  use ice for pain and swelling after surgery. °Do not use any lotions or creams on the incision until instructed by your surgeon. ° °

## 2018-08-21 NOTE — Transfer of Care (Signed)
Immediate Anesthesia Transfer of Care Note  Patient: Terri [name]  Procedure(s) Performed: Right total knee arthroplasty revision (Right Knee)  Patient Location: PACU  Anesthesia Type:Spinal and MAC combined with regional for post-op pain  Level of Consciousness: awake, alert , oriented and patient cooperative  Airway & Oxygen Therapy: Patient Spontanous Breathing and Patient connected to face mask oxygen  Post-op Assessment: Report given to RN and Post -op Vital signs reviewed and stable  Post vital signs: Reviewed and stable  Last Vitals:  Vitals Value Taken Time  BP 108/61 08/21/2018 11:39 AM  Temp    Pulse 85 08/21/2018 11:41 AM  Resp 16 08/21/2018 11:41 AM  SpO2 97 % 08/21/2018 11:41 AM  Vitals shown include unvalidated device data.  Last Pain:  Vitals:   08/21/18 0803  TempSrc: Oral  PainSc:          Complications: No apparent anesthesia complications

## 2018-08-21 NOTE — Evaluation (Signed)
Physical Therapy Evaluation [name] MRN: [id number] DOB: 1970/11/26 Today's Date: 08/21/2018   History of Present Illness  Pt s/p R TKR tibial and poly revision  Clinical Impression  Pt s/p R TKR revision and presents with decreased R LE strength/ROM and post op pain limiting functional mobility.  Pt should progress to dc home with assist of friends/family and plans follow up OP PT starting 08/26/18.    Follow Up Recommendations Follow surgeon's recommendation for DC plan and follow-up therapies    Equipment Recommendations  None recommended by PT    Recommendations for Other Services       Precautions / Restrictions Precautions Precautions: Fall Restrictions Weight Bearing Restrictions: No      Mobility  Bed Mobility Overal bed mobility: Needs Assistance Bed Mobility: Supine to Sit     Supine to sit: Min assist     General bed mobility comments: cues for sequence and use of L LE to self assist  Transfers Overall transfer level: Needs assistance Equipment used: Rolling walker (2 wheeled) Transfers: Sit to/from Stand Sit to Stand: Min assist         General transfer comment: cues for LE management and use of UEs to self assist  Ambulation/Gait Ambulation/Gait assistance: Min assist Gait Distance (Feet): 100 Feet Assistive device: Rolling walker (2 wheeled) Gait Pattern/deviations: Step-to pattern;Step-through pattern;Decreased step length - right;Decreased step length - left;Shuffle;Trunk flexed Gait velocity: decr   General Gait Details: cues for sequence, posture and position from [employer]            Wheelchair Mobility    Modified Rankin (Stroke Patients Only)       Balance Overall balance assessment: Mild deficits observed, not formally tested                                           Pertinent Vitals/Pain Pain Assessment: 0-10 Pain Score: 3  Pain Location: R knee Pain Descriptors /  Indicators: Aching;Sore Pain Intervention(s): Limited activity within patient's tolerance;Monitored during session;Premedicated before session;Ice applied    Home Living Family/patient expects to be discharged to:: Private residence Living Arrangements: Alone Available Help at Discharge: Family;Friend(s) Type of Home: House Home Access: Level entry     Home Layout: One level Home Equipment: Environmental consultant - 2 wheels;Shower seat;Crutches      Prior Function Level of Independence: Independent               Hand Dominance        Extremity/Trunk Assessment   Upper Extremity Assessment Upper Extremity Assessment: Overall WFL for tasks assessed    Lower Extremity Assessment Lower Extremity Assessment: RLE deficits/detail    Cervical / Trunk Assessment Cervical / Trunk Assessment: Normal  Communication   Communication: No difficulties  Cognition Arousal/Alertness: Awake/alert Behavior During Therapy: WFL for tasks assessed/performed Overall Cognitive Status: Within Functional Limits for tasks assessed                                        General Comments      Exercises Total Joint Exercises Ankle Circles/Pumps: AROM;Both;15 reps;Supine   Assessment/Plan    PT Assessment Patient needs continued PT services  PT Problem List Decreased strength;Decreased range of motion;Decreased activity tolerance;Decreased balance;Decreased mobility;Pain;Decreased knowledge of use of DME  PT Treatment Interventions DME instruction;Gait training;Stair training;Functional mobility training;Therapeutic activities;Therapeutic exercise;Patient/family education    PT Goals (Current goals can be found in the Care Plan section)  Acute Rehab PT Goals Patient Stated Goal: Regain IND PT Goal Formulation: With patient Time For Goal Achievement: 08/28/18 Potential to Achieve Goals: Good    Frequency 7X/week   Barriers to discharge        Co-evaluation                AM-PAC PT "6 Clicks" Mobility  Outcome Measure Help needed turning from your back to your side while in a flat bed without using bedrails?: A Little Help needed moving from lying on your back to sitting on the side of a flat bed without using bedrails?: A Little Help needed moving to and from a bed to a chair (including a wheelchair)?: A Little Help needed standing up from a chair using your arms (e.g., wheelchair or bedside chair)?: A Little Help needed to walk in hospital room?: A Little Help needed climbing 3-5 steps with a railing? : A Little 6 Click Score: 18    End of Session Equipment Utilized During Treatment: Gait belt;Right knee immobilizer Activity Tolerance: Patient tolerated treatment well Patient left: in chair;with call bell/phone within reach;with family/visitor present Nurse Communication: Mobility status PT Visit Diagnosis: Difficulty in walking, not elsewhere classified (R26.2)    Time: 7846-9629 PT Time Calculation (min) (ACUTE ONLY): 30 min   Charges:   PT Evaluation $PT Eval Low Complexity: 1 Low PT Treatments $Gait Training: 8-22 mins        Debe Coder PT Acute Rehabilitation Services Pager [phone] Office [phone]   Fallan Mccarey 08/21/2018, 5:06 PM

## 2018-08-21 NOTE — Anesthesia Procedure Notes (Addendum)
Anesthesia Regional Block: Adductor canal block   Pre-Anesthetic Checklist: ,, timeout performed, Correct Patient, Correct Site, Correct Laterality, Correct Procedure, Correct Position, site marked, Risks and benefits discussed,  Surgical consent,  Pre-op evaluation,  At surgeon's request and post-op pain management  Laterality: Lower and Right  Prep: chloraprep       Needles:  Injection technique: Single-shot  Needle Type: Echogenic Stimulator Needle     Needle Length: 10cm  Needle Gauge: 21   Needle insertion depth: 2.5 cm   Additional Needles:   Procedures:,,,, ultrasound used (permanent image in chart),,,,  Narrative:  Start time: 08/21/2018 8:56 AM End time: 08/21/2018 9:06 AM Injection made incrementally with aspirations every 5 mL.  Performed by: Personally  Anesthesiologist: Lyn Hollingshead, MD

## 2018-08-21 NOTE — Anesthesia Procedure Notes (Signed)
Procedure Name: MAC Date/Time: 08/21/2018 9:43 AM Performed by: West Pugh, CRNA Pre-anesthesia Checklist: Patient identified, Emergency Drugs available, Suction available, Patient being monitored and Timeout performed Patient Re-evaluated:Patient Re-evaluated prior to induction Oxygen Delivery Method: Simple face mask and Nasal cannula Preoxygenation: Pre-oxygenation with 100% oxygen Induction Type: IV induction Number of attempts: 1 Placement Confirmation: positive ETCO2 Dental Injury: Teeth and Oropharynx as per pre-operative assessment

## 2018-08-21 NOTE — Op Note (Signed)
Terri [name], MALES MEDICAL RECORD [id number] ACCOUNT [id number] DATE OF BIRTH:March 27, 1971 FACILITY: [facility] LOCATION: [facility] PHYSICIAN:Falynn Ailey Zella Ball, MD  OPERATIVE REPORT  DATE OF PROCEDURE:  08/21/2018  PREOPERATIVE DIAGNOSIS: 1. Failed right total knee arthroplasty.     2.Osteoarthritis Left knee  POSTOPERATIVE DIAGNOSIS:  1.Failed right total knee arthroplasty.     2. Osteoarthritis Left knee  PROCEDURE:  1.Right knee the tibial and polyethylene revision.    2. Left knee cortisone injection  SURGEON:  Gaynelle Arabian, MD  ASSISTANT:  Ladene Artist, PA-C  .  ANESTHESIA:  Adductor canal block and spinal.  ESTIMATED BLOOD LOSS:  50 mL.  DRAINS:  Hemovac x1.  TOURNIQUET TIME:  56 minutes at 300 mmHg.  COMPLICATIONS:  None.  CONDITION:  Stable to recovery.  BRIEF CLINICAL NOTE:  This a 47 year old female who had a polyethylene revision of right total knee for instability approximately a year ago.  She did extremely well initially, then had a fall, landing directly on a flexed right knee and has had pain and  instability since.  She has failed nonoperative management including bracing and physical therapy and presents now for polyethylene versus total knee revision.  PROCEDURE IN DETAIL:  After successful administration of adductor canal block and spinal anesthetic, a tourniquet was placed high on her right thigh and right lower extremity was prepped and draped in the usual sterile fashion.  Extremity was wrapped in  Esmarch and tourniquet inflated to 300 mmHg.  Midline incision was made with a 10 blade through subcutaneous tissue to the level of the extensor mechanism.  A fresh blade was used to make a medial parapatellar arthrotomy.  Soft tissue on the proximal  medial tibia subperiosteally elevated the joint line with a knife and at the semimembranosus bursa with a Cobb elevator.  Soft tissue laterally was elevated with attention being paid to avoid the patellar tendon  on the tibial tubercle.  I did excision of  scar.  Under the extensor mechanism, I was able to evert her patella.  The knee was flexed 90 degrees.  There was significant amount of AP laxity and flexion.  I was able to remove the tibial polyethylene.  Upon removing that, I checked the tibial  component and with some upward pressure from the base of the tray, this was moving consistent with a loose tibial component.  We placed circumferential retractors around the tibia, subluxed it forward and removed the tibial component by disrupting the remainder of the interface between the bone and prosthesis with an oscillating saw.  It was removed and then the cement was  removed from the tibial canal.  We thoroughly irrigated the canal and then reamed up to 14 mm for a 14 mm stem extension.  Size 5 for the DePuy Attune revision system was the most appropriate size for the tibia.  The proximal tibia was prepared with the  modular drill for the size 5.  I then prepared for a 29 mm sleeve with the broach.  We then did the keel punch through the trial.  The trial size 5 tibia is placed.  We had 5 mm augments medially and laterally on the tibia.  I went up to a 12 mm insert  and with the 12,  full extension was achieved with excellent varus/valgus and anterior/posterior balance throughout full range of motion.  The patella tracks normally.  The femoral component was inspected.  It was well fixed and well positioned.  We then assembled the tibial component on  the back table, which is a size 5 Attune revision tibia with 5 mm augments medial and lateral, a 29 sleeve and a 14 x 30 stem extension.  Cement is mixed and once ready for implantation,  the cement was injected  in the canal and then the component was cemented into position.  All extruded cement was removed.  Trial 12 mm insert was placed.  Knee held in full extension.  The rest of the extruded cement was removed.  Once cement hardened, a permanent 12 mm  rotating  platform insert for the Attune system is placed into the tibial tray.  The knee was reduced with outstanding stability throughout full range of motion.  Twenty mL of Exparel mixed with 60 mL of saline was then injected the posterior capsule of  the extensor mechanism, the subcutaneous tissues.  The wound was copiously irrigated with saline solution and the arthrotomy closed over a Hemovac drain with a running 0 Stratafix suture.  Tourniquet was released.  Total time of 56 minutes.  Minor bleeding was stopped with cautery.  Subcutaneous was closed with interrupted 2-0 Vicryl and subcuticular running 4-0 Monocryl.  The incision was cleaned and dried and Steri-Strips and a bulky sterile dressing are applied.   I then prepped the left knee with Betadine and injected the left knee with 2 ml (80 mg) Depomedrol with no problems.   She was then awakened and transported to recovery in stable condition.  Please note that a surgical assistant was a medical necessity for this procedure.  Assistance was necessary for retraction of vital ligaments and neurovascular structures and for proper positioning of the limb for safe removal of the old implant and safe  and accurate placement of the new implant.  AN/NUANCE  D:08/21/2018 T:08/21/2018 JOB:[id number]

## 2018-08-21 NOTE — Anesthesia Postprocedure Evaluation (Signed)
Anesthesia Post Note  Patient: Terri [name]  Procedure(s) Performed: Right total knee arthroplasty revision (Right Knee)     Patient location during evaluation: PACU Anesthesia Type: Spinal Level of consciousness: awake Pain management: pain level controlled Vital Signs Assessment: post-procedure vital signs reviewed and stable Respiratory status: spontaneous breathing Cardiovascular status: stable Postop Assessment: no headache, no backache, spinal receding, patient able to bend at knees and no apparent nausea or vomiting Anesthetic complications: no    Last Vitals:  Vitals:   08/21/18 1315 08/21/18 1341  BP: (!) 100/49 101/63  Pulse: 62 66  Resp: 15 15  Temp: 36.5 C 36.8 C  SpO2: 94% 96%    Last Pain:  Vitals:   08/21/18 1341  TempSrc: Oral  PainSc:    Pain Goal:                 Huston Foley

## 2018-08-21 NOTE — Anesthesia Preprocedure Evaluation (Signed)
Anesthesia Evaluation  Patient identified by MRN, date of birth, ID band Patient awake    Reviewed: Allergy & Precautions, NPO status , Patient's Chart, lab work & pertinent test results  History of Anesthesia Complications (+) PONV and history of anesthetic complications  Airway Mallampati: I       Dental no notable dental hx. (+) Teeth Intact   Pulmonary former smoker,    Pulmonary exam normal breath sounds clear to auscultation       Cardiovascular hypertension, Pt. on medications Normal cardiovascular exam Rhythm:Regular Rate:Normal     Neuro/Psych PSYCHIATRIC DISORDERS Anxiety Depression    GI/Hepatic   Endo/Other  diabetes, Oral Hypoglycemic Agents  Renal/GU      Musculoskeletal   Abdominal (+) + obese,   Peds  Hematology   Anesthesia Other Findings   Reproductive/Obstetrics                             Anesthesia Physical  Anesthesia Plan  ASA: II  Anesthesia Plan: Spinal   Post-op Pain Management:  Regional for Post-op pain   Induction:   PONV Risk Score and Plan: Ondansetron and Dexamethasone  Airway Management Planned: Natural Airway, Nasal Cannula and Simple Face Mask  Additional Equipment:   Intra-op Plan:   Post-operative Plan:   Informed Consent: I have reviewed the patients History and Physical, chart, labs and discussed the procedure including the risks, benefits and alternatives for the proposed anesthesia with the patient or authorized representative who has indicated his/her understanding and acceptance.     Plan Discussed with: CRNA and Surgeon  Anesthesia Plan Comments:         Anesthesia Quick Evaluation

## 2018-08-21 NOTE — Anesthesia Procedure Notes (Deleted)
Spinal

## 2018-08-22 ENCOUNTER — Encounter (HOSPITAL_COMMUNITY): Payer: Self-pay | Admitting: Orthopedic Surgery

## 2018-08-22 LAB — CBC
HCT: 40 % (ref 36.0–46.0)
Hemoglobin: 12.6 g/dL (ref 12.0–15.0)
MCH: 27.6 pg (ref 26.0–34.0)
MCHC: 31.5 g/dL (ref 30.0–36.0)
MCV: 87.5 fL (ref 80.0–100.0)
Platelets: 181 10*3/uL (ref 150–400)
RBC: 4.57 MIL/uL (ref 3.87–5.11)
RDW: 13.5 % (ref 11.5–15.5)
WBC: 11 10*3/uL — ABNORMAL HIGH (ref 4.0–10.5)
nRBC: 0 % (ref 0.0–0.2)

## 2018-08-22 LAB — BASIC METABOLIC PANEL
Anion gap: 9 (ref 5–15)
BUN: 10 mg/dL (ref 6–20)
CO2: 24 mmol/L (ref 22–32)
CREATININE: 0.44 mg/dL (ref 0.44–1.00)
Calcium: 8.8 mg/dL — ABNORMAL LOW (ref 8.9–10.3)
Chloride: 108 mmol/L (ref 98–111)
GFR calc Af Amer: >60 mL/min (ref >60)
GFR calc non Af Amer: >60 mL/min (ref >60)
Glucose, Bld: 267 mg/dL — ABNORMAL HIGH (ref 70–99)
POTASSIUM: 4.1 mmol/L (ref 3.5–5.1)
Sodium: 141 mmol/L (ref 135–145)

## 2018-08-22 LAB — GLUCOSE, CAPILLARY
Glucose-Capillary: 197 mg/dL — ABNORMAL HIGH (ref 70–99)
Glucose-Capillary: 214 mg/dL — ABNORMAL HIGH (ref 70–99)

## 2018-08-22 MED ORDER — GABAPENTIN 300 MG PO CAPS: 300.0000 mg | ORAL_CAPSULE | Freq: Three times a day (TID) | ORAL | 0 refills | Status: AC

## 2018-08-22 MED ORDER — OXYCODONE HCL 5 MG PO TABS: 5.0000 mg | ORAL_TABLET | Freq: Four times a day (QID) | ORAL | 0 refills | Status: AC | PRN

## 2018-08-22 MED ORDER — METHOCARBAMOL 500 MG PO TABS: 500.0000 mg | ORAL_TABLET | Freq: Four times a day (QID) | ORAL | 0 refills | Status: AC | PRN

## 2018-08-22 MED ORDER — ASPIRIN 325 MG PO TBEC: 325.0000 mg | DELAYED_RELEASE_TABLET | Freq: Two times a day (BID) | ORAL | 0 refills | Status: AC

## 2018-08-22 NOTE — Progress Notes (Signed)
Physical Therapy Treatment [name] MRN: [id number] DOB: 1970-11-28 Today's Date: 08/22/2018    History of Present Illness Pt s/p R TKR tibial and poly revision    PT Comments    Pt motivated and progressing well with mobility.     Follow Up Recommendations  Follow surgeon's recommendation for DC plan and follow-up therapies     Equipment Recommendations  None recommended by PT    Recommendations for Other Services       Precautions / Restrictions Precautions Precautions: Fall Restrictions Weight Bearing Restrictions: No    Mobility  Bed Mobility               General bed mobility comments: Pt up in chair and requests back to same  Transfers Overall transfer level: Needs assistance Equipment used: Rolling walker (2 wheeled) Transfers: Sit to/from Stand Sit to Stand: Min guard;Supervision         General transfer comment: cues for LE management and use of UEs to self assist  Ambulation/Gait Ambulation/Gait assistance: Min guard;Supervision Gait Distance (Feet): 200 Feet Assistive device: Rolling walker (2 wheeled) Gait Pattern/deviations: Step-to pattern;Step-through pattern;Decreased step length - right;Decreased step length - left;Shuffle;Trunk flexed Gait velocity: decr   General Gait Details: min cues for sequence, posture and position from RW   Stairs Stairs: Yes Stairs assistance: Min assist Stair Management: Two rails;Step to pattern;Forwards Number of Stairs: 5 General stair comments: cues for sequence   Wheelchair Mobility    Modified Rankin (Stroke Patients Only)       Balance Overall balance assessment: Mild deficits observed, not formally tested                                          Cognition Arousal/Alertness: Awake/alert Behavior During Therapy: WFL for tasks assessed/performed Overall Cognitive Status: Within Functional Limits for tasks assessed                                         Exercises Total Joint Exercises Ankle Circles/Pumps: AROM;Both;15 reps;Supine Quad Sets: AROM;Both;10 reps;Supine Heel Slides: AAROM;Right;15 reps;Supine Straight Leg Raises: AAROM;AROM;Right;15 reps;Supine Goniometric ROM: AAROM R knee -10 - 75    General Comments        Pertinent Vitals/Pain Pain Assessment: 0-10 Pain Score: 4  Pain Location: R knee Pain Descriptors / Indicators: Aching;Sore Pain Intervention(s): Limited activity within patient's tolerance;Monitored during session;Premedicated before session;Ice applied    Home Living                      Prior Function            PT Goals (current goals can now be found in the care plan section) Acute Rehab PT Goals Patient Stated Goal: Regain IND PT Goal Formulation: With patient Time For Goal Achievement: 08/28/18 Potential to Achieve Goals: Good Progress towards PT goals: Progressing toward goals    Frequency    7X/week      PT Plan Current plan remains appropriate    Co-evaluation              AM-PAC PT "6 Clicks" Mobility   Outcome Measure  Help needed turning from your back to your side while in a flat bed without using bedrails?: A Little Help needed moving from lying  on your back to sitting on the side of a flat bed without using bedrails?: A Little Help needed moving to and from a bed to a chair (including a wheelchair)?: A Little Help needed standing up from a chair using your arms (e.g., wheelchair or bedside chair)?: A Little Help needed to walk in hospital room?: A Little Help needed climbing 3-5 steps with a railing? : A Little 6 Click Score: 18    End of Session Equipment Utilized During Treatment: Gait belt Activity Tolerance: Patient tolerated treatment well Patient left: in chair;with call bell/phone within reach Nurse Communication: Mobility status PT Visit Diagnosis: Difficulty in walking, not elsewhere classified (R26.2)     Time:  1308-6578 PT Time Calculation (min) (ACUTE ONLY): 24 min  Charges:  $Gait Training: 8-22 mins $Therapeutic Exercise: 8-22 mins                     [place] Pager [phone] Office [phone]    Tylin Stradley 08/22/2018, 12:27 PM

## 2018-08-22 NOTE — Progress Notes (Signed)
Physical Therapy Treatment [name] MRN: [id number] DOB: [birth year] Today's Date: 08/22/2018    History of Present Illness Pt s/p R TKR tibial and poly revision    PT Comments    Home therex program reviewed and progressed with written instruction provided.     Follow Up Recommendations  Follow surgeon's recommendation for DC plan and follow-up therapies     Equipment Recommendations  None recommended by PT    Recommendations for Other Services       Precautions / Restrictions Precautions Precautions: Fall Restrictions Weight Bearing Restrictions: No    Mobility  Bed Mobility               General bed mobility comments: Pt up in chair and requests back to same  Transfers Overall transfer level: Needs assistance Equipment used: Rolling walker (2 wheeled) Transfers: Sit to/from Stand Sit to Stand: Min guard;Supervision         General transfer comment: cues for LE management and use of UEs to self assist  Ambulation/Gait Ambulation/Gait assistance: Min guard;Supervision Gait Distance (Feet): 200 Feet Assistive device: Rolling walker (2 wheeled) Gait Pattern/deviations: Step-to pattern;Step-through pattern;Decreased step length - right;Decreased step length - left;Shuffle;Trunk flexed Gait velocity: decr   General Gait Details: min cues for sequence, posture and position from RW   Stairs Stairs: Yes Stairs assistance: Min assist Stair Management: Two rails;Step to pattern;Forwards Number of Stairs: 5 General stair comments: cues for sequence   Wheelchair Mobility    Modified Rankin (Stroke Patients Only)       Balance Overall balance assessment: Mild deficits observed, not formally tested                                          Cognition Arousal/Alertness: Awake/alert Behavior During Therapy: WFL for tasks assessed/performed Overall Cognitive Status: Within Functional Limits for tasks  assessed                                        Exercises Total Joint Exercises Ankle Circles/Pumps: AROM;Both;15 reps;Supine Quad Sets: AROM;Both;10 reps;Supine Heel Slides: AAROM;Right;15 reps;Supine Straight Leg Raises: AAROM;AROM;Right;15 reps;Supine Long Arc Quad: AROM;Right;10 reps;Seated Knee Flexion: AAROM;AROM;15 reps Goniometric ROM: AAROM R knee -10 - 75    General Comments        Pertinent Vitals/Pain Pain Assessment: 0-10 Pain Score: 6  Pain Location: R knee Pain Descriptors / Indicators: Aching;Sore Pain Intervention(s): Limited activity within patient's tolerance;Monitored during session;Premedicated before session;Ice applied    Home Living                      Prior Function            PT Goals (current goals can now be found in the care plan section) Acute Rehab PT Goals Patient Stated Goal: Regain IND PT Goal Formulation: With patient Time For Goal Achievement: 08/28/18 Potential to Achieve Goals: Good Progress towards PT goals: Progressing toward goals    Frequency    7X/week      PT Plan Current plan remains appropriate    Co-evaluation              AM-PAC PT "6 Clicks" Mobility   Outcome Measure  Help needed turning from your back to your side while in a  flat bed without using bedrails?: A Little Help needed moving from lying on your back to sitting on the side of a flat bed without using bedrails?: A Little Help needed moving to and from a bed to a chair (including a wheelchair)?: A Little Help needed standing up from a chair using your arms (e.g., wheelchair or bedside chair)?: A Little Help needed to walk in hospital room?: A Little Help needed climbing 3-5 steps with a railing? : A Little 6 Click Score: 18    End of Session Equipment Utilized During Treatment: Gait belt Activity Tolerance: Patient tolerated treatment well Patient left: in chair;with call bell/phone within reach Nurse  Communication: Mobility status PT Visit Diagnosis: Difficulty in walking, not elsewhere classified (R26.2)     Time: 1791-5056 PT Time Calculation (min) (ACUTE ONLY): 22 min  Charges:  $Gait Training: 8-22 mins $Therapeutic Exercise: 8-22 mins                     [place] Pager [phone] Office [phone]    Eustace Hur 08/22/2018, 12:35 PM

## 2018-08-22 NOTE — Progress Notes (Addendum)
Inpatient Diabetes Program Recommendations  AACE/ADA: New Consensus Statement on Inpatient Glycemic Control (2015)  Target Ranges:  Prepandial:   less than 140 mg/dL      Peak postprandial:   less than 180 mg/dL (1-2 hours)      Critically ill patients:  140 - 180 mg/dL   Results for Terri [name], Terri [name] (MRN [id number]) as of 08/22/2018 09:46  Ref. Range 08/21/2018 07:47 08/21/2018 11:44 08/21/2018 17:02 08/21/2018 21:49  Glucose-Capillary Latest Ref Range: 70 - 99 mg/dL 176 (H)  8 mg Decadron at 10am 144 (H) 244 (H)  5 units NOVOLOG  164 (H)   Results for Terri [name], Terri [name] (MRN [id number]) as of 08/22/2018 09:46  Ref. Range 08/22/2018 07:36  Glucose-Capillary Latest Ref Range: 70 - 99 mg/dL 197 (H)  3 units NOVOLOG     Home DM Meds: Soliqua 20 units Daily [combination medication of Lantus (insulin glargine) + Adlyxin (lixisenatide)]       Synjardy 2.5/500 1 tablet BID   Current Orders: Soliqua 20 units Daily      Novolog Moderate Correction Scale/ SSI (0-15 units) TID AC      Invokana 100 mg Daily       Metformin 500 mg BID     Each unit of Soliqua delivers 1 unit of Lantus (insulin glargine).  Unless patient brought their Willeen Niece to the hospital, Pharmacy will need to substitute Lantus for the [facility].  Pharmacy/ MD- Please d/c Willeen Niece and place orders for Lantus 20 units Daily      --Will follow patient during hospitalization--  Wyn Quaker RN, MSN, CDE Diabetes Coordinator Inpatient Glycemic Control Team Team Pager: [phone] (8a-5p)

## 2018-08-22 NOTE — Progress Notes (Signed)
RN reviewed discharge instructions with patient and family. All questions answered.   Paperwork and prescriptions given.   NT rolled patient down with all belongings to family car. 

## 2018-08-22 NOTE — Plan of Care (Signed)
Pt is stable. Plan of care reviewed with pt. Pain management in progress, effective.

## 2018-08-22 NOTE — Progress Notes (Signed)
   Subjective: 1 Day Post-Op Procedure(s) (LRB): Right total knee arthroplasty revision (Right) Patient reports pain as mild.   Patient seen in rounds with Dr. Wynelle Link. Patient is well, and has had no acute complaints or problems other than discomfort in the right knee. Foley catheter removed this AM. No issues overnight. Denies chest pain or SOB.  We will continue therapy today.   Objective: Vital signs in last 24 hours: Temp:  [97.5 F (36.4 C)-98.7 F (37.1 C)] 98.5 F (36.9 C) (12/05 0521) Pulse Rate:  [58-93] 93 (12/05 0521) Resp:  [12-21] 17 (12/05 0521) BP: (100-122)/(49-80) 120/65 (12/05 0521) SpO2:  [90 %-100 %] 90 % (12/05 0521)  Intake/Output from previous day:  Intake/Output Summary (Last 24 hours) at 08/22/2018 0804 Last data filed at 08/22/2018 0600 Gross per 24 hour  Intake 3858.56 ml  Output 4485 ml  Net -626.44 ml    Labs: Recent Labs    08/22/18 0545  HGB 12.6   Recent Labs    08/22/18 0545  WBC 11.0*  RBC 4.57  HCT 40.0  PLT 181   Recent Labs    08/22/18 0545  NA 141  K 4.1  CL 108  CO2 24  BUN 10  CREATININE 0.44  GLUCOSE 267*  CALCIUM 8.8*   Exam: General - Patient is Alert and Oriented Extremity - Neurologically intact Neurovascular intact Sensation intact distally Dorsiflexion/Plantar flexion intact Dressing - dressing C/D/I Motor Function - intact, moving foot and toes well on exam.   Past Medical History:  Diagnosis Date  . Anxiety   . Arthritis    knee pain  . Atypical chest pain   . Cancer ([place])    skin cancer neck removed  . Complication of anesthesia    SLIGHT NAUSEA  . Diabetes mellitus without complication ([place])    type 2  . Diastasis recti   . Headache    hx of migraines  . Hyperlipidemia   . Hypertension   . Malaise and fatigue   . Moderate obesity   . Panic attack   . PONV (postoperative nausea and vomiting)   . Sleep apnea    cpap  . Snoring     Assessment/Plan: 1 Day Post-Op Procedure(s)  (LRB): Right total knee arthroplasty revision (Right) Active Problems:   Failed total knee arthroplasty (HCC)  Estimated body mass index is 32.93 kg/m as calculated from the following:   Height as of this encounter: 5\' 7"  (1.702 m).   Weight as of this encounter: 95.4 kg. Advance diet Up with therapy D/C IV fluids  DVT Prophylaxis - Aspirin Weight bearing as tolerated. D/C O2 and pulse ox and try on room air. Hemovac pulled without difficulty, will continue therapy.  Plan is to go Home after hospital stay. Plan for discharge after two sessions of therapy today as long as meeting goals. Scheduled for outpatient physical therapy at [facility]. Follow-up in the office in 2 weeks with Dr. Wynelle Link.  Theresa Duty, PA-C Orthopedic Surgery 08/22/2018, 8:04 AM

## 2018-08-22 NOTE — Care Management Note (Signed)
Case Management Note  Patient Details  Name: Raela Bohl MRN: [id number] Date of Birth: [birth year]  Subjective/Objective:   Spoke with patient at bedside. Confirmed plan for OP PT, already arranged. Needs a 3n1. [phone]                 Action/Plan: Contacted [facility] to deliver to the room.  Expected Discharge Date:  08/22/18               Expected Discharge Plan:  OP Rehab  In-House Referral:  NA  Discharge planning Services  CM Consult  Post Acute Care Choice:  Durable Medical Equipment Choice offered to:  Patient  DME Arranged:  3-N-1 DME Agency:  NA  HH Arranged:  NA HH Agency:  NA  Status of Service:  Completed, signed off  If discussed at [place] of Stay Meetings, dates discussed:    Additional Comments:  Guadalupe Maple, RN 08/22/2018, 11:26 AM

## 2018-08-26 DIAGNOSIS — M25561 Pain in right knee: Secondary | ICD-10-CM | POA: Diagnosis not present

## 2018-08-27 NOTE — Discharge Summary (Signed)
Physician Discharge Summary   Patient ID: Terri [name] MRN: [id number] DOB/AGE: 1971-03-18 47 y.o.  Admit date: 08/21/2018 Discharge date: 08/22/2018  Primary Diagnosis: Failed right total knee arthroplasty and osteoarthritis, left knee   Admission Diagnoses:  Past Medical History:  Diagnosis Date  . Anxiety   . Arthritis    knee pain  . Atypical chest pain   . Cancer ([place])    skin cancer neck removed  . Complication of anesthesia    SLIGHT NAUSEA  . Diabetes mellitus without complication ([place])    type 2  . Diastasis recti   . Headache    hx of migraines  . Hyperlipidemia   . Hypertension   . Malaise and fatigue   . Moderate obesity   . Panic attack   . PONV (postoperative nausea and vomiting)   . Sleep apnea    cpap  . Snoring    Discharge Diagnoses:   Active Problems:   Failed total knee arthroplasty ([place])  Estimated body mass index is 32.93 kg/m as calculated from the following:   Height as of this encounter: 5\' 7"  (1.702 m).   Weight as of this encounter: 95.4 kg.  Procedure:  Procedure(s) (LRB): Right total knee arthroplasty revision (Right)   Consults: None  HPI: This a 47 year old female who had a polyethylene revision of right total knee for instability approximately a year ago.  She did extremely well initially, then had a fall, landing directly on a flexed right knee and has had pain and instability since.  She has failed nonoperative management including bracing and physical therapy and presents now for polyethylene versus total knee revision.  Laboratory Data: Admission on 08/21/2018, Discharged on 08/22/2018  Component Date Value Ref Range Status  . Glucose-Capillary 08/21/2018 176* 70 - 99 mg/dL Final  . Glucose-Capillary 08/21/2018 144* 70 - 99 mg/dL Final  . WBC 08/22/2018 11.0* 4.0 - 10.5 K/uL Final  . RBC 08/22/2018 4.57  3.87 - 5.11 MIL/uL Final  . Hemoglobin 08/22/2018 12.6  12.0 - 15.0 g/dL Final  . HCT 08/22/2018 40.0  36.0 - 46.0 %  Final  . MCV 08/22/2018 87.5  80.0 - 100.0 fL Final  . MCH 08/22/2018 27.6  26.0 - 34.0 pg Final  . MCHC 08/22/2018 31.5  30.0 - 36.0 g/dL Final  . RDW 08/22/2018 13.5  11.5 - 15.5 % Final  . Platelets 08/22/2018 181  150 - 400 K/uL Final  . nRBC 08/22/2018 0.0  0.0 - 0.2 % Final   Performed at [facility], [place] [address], [place], [place] [address]  . Sodium 08/22/2018 141  135 - 145 mmol/L Final  . Potassium 08/22/2018 4.1  3.5 - 5.1 mmol/L Final  . Chloride 08/22/2018 108  98 - 111 mmol/L Final  . CO2 08/22/2018 24  22 - 32 mmol/L Final  . Glucose, Bld 08/22/2018 267* 70 - 99 mg/dL Final  . BUN 08/22/2018 10  6 - 20 mg/dL Final  . Creatinine, Ser 08/22/2018 0.44  0.44 - 1.00 mg/dL Final  . Calcium 08/22/2018 8.8* 8.9 - 10.3 mg/dL Final  . GFR calc non Af Amer 08/22/2018 >60  >60 mL/min Final  . GFR calc Af Amer 08/22/2018 >60  >60 mL/min Final  . Anion gap 08/22/2018 9  5 - 15 Final   Performed at [facility], [place] [address] [place], [place] [address]  . Glucose-Capillary 08/21/2018 244* 70 - 99 mg/dL Final  . Glucose-Capillary 08/21/2018 164* 70 - 99  mg/dL Final  . Glucose-Capillary 08/22/2018 197* 70 - 99 mg/dL Final  . Glucose-Capillary 08/22/2018 214* 70 - 99 mg/dL Final  Hospital Outpatient Visit on 08/13/2018  Component Date Value Ref Range Status  . aPTT 08/13/2018 26  24 - 36 seconds Final   Performed at [facility], [place] [address], [place], [place] [address]  . WBC 08/13/2018 6.0  4.0 - 10.5 K/uL Final  . RBC 08/13/2018 5.23* 3.87 - 5.11 MIL/uL Final  . Hemoglobin 08/13/2018 14.0  12.0 - 15.0 g/dL Final  . HCT 08/13/2018 45.4  36.0 - 46.0 % Final  . MCV 08/13/2018 86.8  80.0 - 100.0 fL Final  . MCH 08/13/2018 26.8  26.0 - 34.0 pg Final  . MCHC 08/13/2018 30.8  30.0 - 36.0 g/dL Final  . RDW 08/13/2018 13.8  11.5 - 15.5 % Final  . Platelets 08/13/2018 190  150 - 400 K/uL Final  . nRBC 08/13/2018 0.0  0.0 - 0.2  % Final   Performed at [facility], [place] [address], [place], [place] [address]  . Sodium 08/13/2018 141  135 - 145 mmol/L Final  . Potassium 08/13/2018 4.9  3.5 - 5.1 mmol/L Final  . Chloride 08/13/2018 106  98 - 111 mmol/L Final  . CO2 08/13/2018 26  22 - 32 mmol/L Final  . Glucose, Bld 08/13/2018 166* 70 - 99 mg/dL Final  . BUN 08/13/2018 18  6 - 20 mg/dL Final  . Creatinine, Ser 08/13/2018 0.46  0.44 - 1.00 mg/dL Final  . Calcium 08/13/2018 9.7  8.9 - 10.3 mg/dL Final  . Total Protein 08/13/2018 7.5  6.5 - 8.1 g/dL Final  . Albumin 08/13/2018 4.4  3.5 - 5.0 g/dL Final  . AST 08/13/2018 27  15 - 41 U/L Final  . ALT 08/13/2018 35  0 - 44 U/L Final  . Alkaline Phosphatase 08/13/2018 95  38 - 126 U/L Final  . Total Bilirubin 08/13/2018 0.8  0.3 - 1.2 mg/dL Final  . GFR calc non Af Amer 08/13/2018 >60  >60 mL/min Final  . GFR calc Af Amer 08/13/2018 >60  >60 mL/min Final  . Anion gap 08/13/2018 9  5 - 15 Final   Performed at [facility], [place] [address], [place], [place] [address]  . Prothrombin Time 08/13/2018 10.6* 11.4 - 15.2 seconds Final  . INR 08/13/2018 0.76   Final   Performed at [facility], [place] [address], [place], [place] [address]  . ABO/RH(D) 08/13/2018 A POS   Final  . Antibody Screen 08/13/2018 NEG   Final  . Sample Expiration 08/13/2018 08/24/2018   Final  . Extend sample reason 08/13/2018    Final                   Value:NO TRANSFUSIONS OR PREGNANCY IN THE PAST 3 MONTHS Performed at [facility], [place] [address], [place], [place] [address]   . MRSA, PCR 08/13/2018 NEGATIVE  NEGATIVE Final  . Staphylococcus aureus 08/13/2018 NEGATIVE  NEGATIVE Final   Comment: (NOTE) The Xpert SA Assay (FDA approved for NASAL specimens in patients 69 years of age and older), is one component of a comprehensive surveillance program. It is not intended to diagnose infection nor to guide or monitor  treatment. Performed at [facility], [place] [address], [place], [place] [address]   . Hgb A1c MFr Bld 08/13/2018 7.2* 4.8 - 5.6 % Final   Comment: (NOTE) Pre  diabetes:          5.7%-6.4% Diabetes:              >6.4% Glycemic control for   <7.0% adults with diabetes   . Mean Plasma Glucose 08/13/2018 159.94  mg/dL Final   Performed at [facility], [place] [address], [place], [place] [address]  . Preg Test, Ur 08/13/2018 NEGATIVE  NEGATIVE Final   Comment:        THE SENSITIVITY OF THIS METHODOLOGY IS >20 mIU/mL. Performed at [facility], [place] [address], [place], [place] [address]   . Glucose-Capillary 08/13/2018 173* 70 - 99 mg/dL Final     X-Rays:No results found.  EKG: Orders placed or performed during the hospital encounter of 08/29/17  . EKG 12-Lead  . EKG 12-Lead  . EKG 12-Lead  . EKG 12-Lead  . EKG 12-Lead  . EKG 12-Lead     Hospital Course: Terri [name] is a 47 y.o. who was admitted to [facility]. They were brought to the operating room on 08/21/2018 and underwent Procedure(s): Right total knee arthroplasty revision.  Patient tolerated the procedure well and was later transferred to the recovery room and then to the orthopaedic floor for postoperative care. They were given PO and IV analgesics for pain control following their surgery. They were given 24 hours of postoperative antibiotics of  Anti-infectives (From admission, onward)   Start     Dose/Rate Route Frequency Ordered Stop   08/21/18 1600  ceFAZolin (ANCEF) IVPB 2g/100 mL premix     2 g 200 mL/hr over 30 Minutes Intravenous Every 6 hours 08/21/18 1335 08/21/18 2219   08/21/18 0745  ceFAZolin (ANCEF) IVPB 2g/100 mL premix     2 g 200 mL/hr over 30 Minutes Intravenous On call to O.R. 08/21/18 0740 08/21/18 1022     and started on DVT prophylaxis in the form of Aspirin.   PT and OT were ordered for total joint protocol. Discharge planning consulted to help  with postop disposition and equipment needs.  Patient had a good night on the evening of surgery. They started to get up OOB with therapy on POD #0. Pt was seen during rounds and was ready to go home pending progress with therapy. Hemovac drain was pulled without difficulty. She worked with therapy on POD #1 and was meeting her goals. Pt was discharged to home later that day in stable condition.  Diet: Diabetic diet Activity: WBAT Follow-up: in 2 weeks with Dr. Wynelle Link Disposition: Home with outpatient PT at [facility] Discharged Condition: stable   Discharge Instructions    Call MD / Call 911   Complete by:  As directed    If you experience chest pain or shortness of breath, CALL 911 and be transported to the hospital emergency room.  If you develope a fever above 101 F, pus (white drainage) or increased drainage or redness at the wound, or calf pain, call your surgeon's office.   Change dressing   Complete by:  As directed    Change dressing on Friday, then change the dressing daily with sterile 4 x 4 inch gauze dressing and apply TED hose.   Constipation Prevention   Complete by:  As directed    Drink plenty of fluids.  Prune juice may be helpful.  You may use a stool softener, such as Colace (over the counter) 100 mg twice a day.  Use MiraLax (over the counter) for constipation as needed.   Diet -  low sodium heart healthy   Complete by:  As directed    Discharge instructions   Complete by:  As directed    Dr. Gaynelle Arabian Total Joint Specialist [facility] [address], [place], [place] [address] [phone]  TOTAL KNEE REPLACEMENT POSTOPERATIVE DIRECTIONS  Knee Rehabilitation, Guidelines Following Surgery  Results after knee surgery are often greatly improved when you follow the exercise, range of motion and muscle strengthening exercises prescribed by your doctor. Safety measures are also important to protect the knee from further injury. Any time any of these  exercises cause you to have increased pain or swelling in your knee joint, decrease the amount until you are comfortable again and slowly increase them. If you have problems or questions, call your caregiver or physical therapist for advice.   HOME CARE INSTRUCTIONS  Remove items at home which could result in a fall. This includes throw rugs or furniture in walking pathways.  ICE to the affected knee every three hours for 30 minutes at a time and then as needed for pain and swelling.  Continue to use ice on the knee for pain and swelling from surgery. You may notice swelling that will progress down to the foot and ankle.  This is normal after surgery.  Elevate the leg when you are not up walking on it.   Continue to use the breathing machine which will help keep your temperature down.  It is common for your temperature to cycle up and down following surgery, especially at night when you are not up moving around and exerting yourself.  The breathing machine keeps your lungs expanded and your temperature down. Do not place pillow under knee, focus on keeping the knee straight while resting   DIET You may resume your previous home diet once your are discharged from the hospital.  DRESSING / WOUND CARE / SHOWERING You may shower 3 days after surgery, but keep the wounds dry during showering.  You may use an occlusive plastic wrap (Press'n Seal for example), NO SOAKING/SUBMERGING IN THE BATHTUB.  If the bandage gets wet, change with a clean dry gauze.  If the incision gets wet, pat the wound dry with a clean towel. You may start showering once you are discharged home but do not submerge the incision under water. Just pat the incision dry and apply a dry gauze dressing on daily. Change the surgical dressing daily and reapply a dry dressing each time.  ACTIVITY Walk with your walker as instructed. Use walker as long as suggested by your caregivers. Avoid periods of inactivity such as sitting longer than  an hour when not asleep. This helps prevent blood clots.  You may resume a sexual relationship in one month or when given the OK by your doctor.  You may return to work once you are cleared by your doctor.  Do not drive a car for 6 weeks or until released by you surgeon.  Do not drive while taking narcotics.  WEIGHT BEARING Weight bearing as tolerated with assist device (walker, cane, etc) as directed, use it as long as suggested by your surgeon or therapist, typically at least 4-6 weeks.  POSTOPERATIVE CONSTIPATION PROTOCOL Constipation - defined medically as fewer than three stools per week and severe constipation as less than one stool per week.  One of the most common issues patients have following surgery is constipation.  Even if you have a regular bowel pattern at home, your normal regimen is likely to be disrupted  due to multiple reasons following surgery.  Combination of anesthesia, postoperative narcotics, change in appetite and fluid intake all can affect your bowels.  In order to avoid complications following surgery, here are some recommendations in order to help you during your recovery period.  Colace (docusate) - Pick up an over-the-counter form of Colace or another stool softener and take twice a day as long as you are requiring postoperative pain medications.  Take with a full glass of water daily.  If you experience loose stools or diarrhea, hold the colace until you stool forms back up.  If your symptoms do not get better within 1 week or if they get worse, check with your doctor.  Dulcolax (bisacodyl) - Pick up over-the-counter and take as directed by the product packaging as needed to assist with the movement of your bowels.  Take with a full glass of water.  Use this product as needed if not relieved by Colace only.   MiraLax (polyethylene glycol) - Pick up over-the-counter to have on hand.  MiraLax is a solution that will increase the amount of water in your bowels to assist  with bowel movements.  Take as directed and can mix with a glass of water, juice, soda, coffee, or tea.  Take if you go more than two days without a movement. Do not use MiraLax more than once per day. Call your doctor if you are still constipated or irregular after using this medication for 7 days in a row.  If you continue to have problems with postoperative constipation, please contact the office for further assistance and recommendations.  If you experience "the worst abdominal pain ever" or develop nausea or vomiting, please contact the office immediatly for further recommendations for treatment.  ITCHING  If you experience itching with your medications, try taking only a single pain pill, or even half a pain pill at a time.  You can also use Benadryl over the counter for itching or also to help with sleep.   TED HOSE STOCKINGS Wear the elastic stockings on both legs for three weeks following surgery during the day but you may remove then at night for sleeping.  MEDICATIONS See your medication summary on the "After Visit Summary" that the nursing staff will review with you prior to discharge.  You may have some home medications which will be placed on hold until you complete the course of blood thinner medication.  It is important for you to complete the blood thinner medication as prescribed by your surgeon.  Continue your approved medications as instructed at time of discharge.  PRECAUTIONS If you experience chest pain or shortness of breath - call 911 immediately for transfer to the hospital emergency department.  If you develop a fever greater that 101 F, purulent drainage from wound, increased redness or drainage from wound, foul odor from the wound/dressing, or calf pain - CONTACT YOUR SURGEON.                                                   FOLLOW-UP APPOINTMENTS Make sure you keep all of your appointments after your operation with your surgeon and caregivers. You should call the  office at the above phone number and make an appointment for approximately two weeks after the date of your surgery or on the date instructed by your surgeon outlined in  the "After Visit Summary".   RANGE OF MOTION AND STRENGTHENING EXERCISES  Rehabilitation of the knee is important following a knee injury or an operation. After just a few days of immobilization, the muscles of the thigh which control the knee become weakened and shrink (atrophy). Knee exercises are designed to build up the tone and strength of the thigh muscles and to improve knee motion. Often times heat used for twenty to thirty minutes before working out will loosen up your tissues and help with improving the range of motion but do not use heat for the first two weeks following surgery. These exercises can be done on a training (exercise) mat, on the floor, on a table or on a bed. Use what ever works the best and is most comfortable for you Knee exercises include:  Leg Lifts - While your knee is still immobilized in a splint or cast, you can do straight leg raises. Lift the leg to 60 degrees, hold for 3 sec, and slowly lower the leg. Repeat 10-20 times 2-3 times daily. Perform this exercise against resistance later as your knee gets better.  Quad and Hamstring Sets - Tighten up the muscle on the front of the thigh (Quad) and hold for 5-10 sec. Repeat this 10-20 times hourly. Hamstring sets are done by pushing the foot backward against an object and holding for 5-10 sec. Repeat as with quad sets.  Leg Slides: Lying on your back, slowly slide your foot toward your buttocks, bending your knee up off the floor (only go as far as is comfortable). Then slowly slide your foot back down until your leg is flat on the floor again. Angel Wings: Lying on your back spread your legs to the side as far apart as you can without causing discomfort.  A rehabilitation program following serious knee injuries can speed recovery and prevent re-injury in the  future due to weakened muscles. Contact your doctor or a physical therapist for more information on knee rehabilitation.   IF YOU ARE TRANSFERRED TO A SKILLED REHAB FACILITY If the patient is transferred to a skilled rehab facility following release from the hospital, a list of the current medications will be sent to the facility for the patient to continue.  When discharged from the skilled rehab facility, please have the facility set up the patient's [facility] prior to being released. Also, the skilled facility will be responsible for providing the patient with their medications at time of release from the facility to include their pain medication, the muscle relaxants, and their blood thinner medication. If the patient is still at the rehab facility at time of the two week follow up appointment, the skilled rehab facility will also need to assist the patient in arranging follow up appointment in our office and any transportation needs.  MAKE SURE YOU:  Understand these instructions.  Get help right away if you are not doing well or get worse.    Pick up stool softner and laxative for home use following surgery while on pain medications. Do not submerge incision under water. Please use good hand washing techniques while changing dressing each day. May shower starting three days after surgery. Please use a clean towel to pat the incision dry following showers. Continue to use ice for pain and swelling after surgery. Do not use any lotions or creams on the incision until instructed by your surgeon.   Do not put a pillow under the knee. Place it under the heel.  Complete by:  As directed    Driving restrictions   Complete by:  As directed    No driving for two weeks   TED hose   Complete by:  As directed    Use stockings (TED hose) for three weeks on both leg(s).  You may remove them at night for sleeping.   Weight bearing as tolerated   Complete by:  As directed       Allergies as of 08/22/2018   No Known Allergies     Medication List    STOP taking these medications   rivaroxaban 10 MG Tabs tablet Commonly known as:  XARELTO     TAKE these medications   aspirin 325 MG EC tablet Take 1 tablet (325 mg total) by mouth 2 (two) times daily for 20 days. Take one tablet (325 mg) Aspirin two times a day for three weeks following surgery. Then take one baby Aspirin (81 mg) once a day for three weeks. Then discontinue aspirin.   atorvastatin 80 MG tablet Commonly known as:  LIPITOR Take 80 mg by mouth daily.   clonazePAM 1 MG tablet Commonly known as:  KLONOPIN Take 1 mg by mouth 3 (three) times daily as needed for anxiety.   gabapentin 300 MG capsule Commonly known as:  NEURONTIN Take 1 capsule (300 mg total) by mouth 3 (three) times daily. Gabapentin 300 mg Protocol Take a 300 mg capsule three times a day for two weeks following surgery. Then take a 300 mg capsule two times a day for two weeks.  Then take a 300 mg capsule once a day for two weeks.  Then discontinue the Gabapentin.   lisinopril 20 MG tablet Commonly known as:  PRINIVIL,ZESTRIL Take 20 mg by mouth at bedtime.   methocarbamol 500 MG tablet Commonly known as:  ROBAXIN Take 1 tablet (500 mg total) by mouth every 6 (six) hours as needed for muscle spasms.   multivitamin with minerals Tabs tablet Take 1 tablet by mouth daily.   oxyCODONE 5 MG immediate release tablet Commonly known as:  Oxy IR/ROXICODONE Take 1 tablet (5 mg total) by mouth every 6 (six) hours as needed for moderate pain or severe pain. What changed:  when to take this   sertraline 100 MG tablet Commonly known as:  ZOLOFT Take 100 mg by mouth at bedtime.   SINEX REGULAR NA Place 1 spray into both nostrils at bedtime as needed (congestion).   SOLIQUA 100-33 UNT-MCG/ML Sopn Generic drug:  Insulin Glargine-Lixisenatide Inject 20 Units into the skin daily.   SYNJARDY 12.5-500 MG Tabs Generic drug:   Empagliflozin-metFORMIN HCl Take 1 tablet by mouth 2 (two) times daily.   SYSTANE OP Place 1 drop into both eyes 4 (four) times daily. Notes to patient:  Home regimen            Discharge Care Instructions  (From admission, onward)         Start     Ordered   08/22/18 0000  Weight bearing as tolerated     08/22/18 0810   08/22/18 0000  Change dressing    Comments:  Change dressing on Friday, then change the dressing daily with sterile 4 x 4 inch gauze dressing and apply TED hose.   08/22/18 0810         Follow-up Information    Gaynelle Arabian, MD. Schedule an appointment as soon as possible for a visit on 09/05/2018.   Specialty:  Orthopedic Surgery Contact information: [address]  [place] [address] [phone]           Signed: Theresa Duty, PA-C Orthopedic Surgery 08/27/2018, 1:15 PM

## 2018-08-28 DIAGNOSIS — M25561 Pain in right knee: Secondary | ICD-10-CM | POA: Diagnosis not present

## 2018-08-30 DIAGNOSIS — M25561 Pain in right knee: Secondary | ICD-10-CM | POA: Diagnosis not present

## 2018-09-02 DIAGNOSIS — G4733 Obstructive sleep apnea (adult) (pediatric): Secondary | ICD-10-CM | POA: Diagnosis not present

## 2018-09-03 DIAGNOSIS — M25561 Pain in right knee: Secondary | ICD-10-CM | POA: Diagnosis not present

## 2018-09-05 DIAGNOSIS — M25561 Pain in right knee: Secondary | ICD-10-CM | POA: Diagnosis not present

## 2018-09-09 ENCOUNTER — Ambulatory Visit (HOSPITAL_COMMUNITY)
Admission: RE | Admit: 2018-09-09 | Discharge: 2018-09-09 | Disposition: A | Discharge status: Home / Self Care | Payer: 59 | Source: Ambulatory Visit | Attending: Internal Medicine | Admitting: Internal Medicine

## 2018-09-09 ENCOUNTER — Other Ambulatory Visit (HOSPITAL_COMMUNITY): Payer: Self-pay | Admitting: Orthopedic Surgery

## 2018-09-09 DIAGNOSIS — M25561 Pain in right knee: Secondary | ICD-10-CM | POA: Diagnosis not present

## 2018-09-09 DIAGNOSIS — M79661 Pain in right lower leg: Secondary | ICD-10-CM

## 2018-09-09 DIAGNOSIS — M7989 Other specified soft tissue disorders: Principal | ICD-10-CM

## 2018-09-13 DIAGNOSIS — M25561 Pain in right knee: Secondary | ICD-10-CM | POA: Diagnosis not present

## 2018-09-16 DIAGNOSIS — M25561 Pain in right knee: Secondary | ICD-10-CM | POA: Diagnosis not present

## 2018-09-19 DIAGNOSIS — M25561 Pain in right knee: Secondary | ICD-10-CM | POA: Diagnosis not present

## 2018-09-23 DIAGNOSIS — M25561 Pain in right knee: Secondary | ICD-10-CM | POA: Diagnosis not present

## 2018-09-24 DIAGNOSIS — Z96651 Presence of right artificial knee joint: Secondary | ICD-10-CM | POA: Diagnosis not present

## 2018-09-24 DIAGNOSIS — Z471 Aftercare following joint replacement surgery: Secondary | ICD-10-CM | POA: Diagnosis not present

## 2018-09-25 DIAGNOSIS — M25561 Pain in right knee: Secondary | ICD-10-CM | POA: Diagnosis not present

## 2018-09-27 DIAGNOSIS — M25561 Pain in right knee: Secondary | ICD-10-CM | POA: Diagnosis not present

## 2018-09-30 DIAGNOSIS — M25561 Pain in right knee: Secondary | ICD-10-CM | POA: Diagnosis not present

## 2018-10-02 DIAGNOSIS — M25561 Pain in right knee: Secondary | ICD-10-CM | POA: Diagnosis not present

## 2018-10-04 DIAGNOSIS — M25561 Pain in right knee: Secondary | ICD-10-CM | POA: Diagnosis not present

## 2018-10-07 DIAGNOSIS — M25561 Pain in right knee: Secondary | ICD-10-CM | POA: Diagnosis not present

## 2018-10-09 DIAGNOSIS — M25561 Pain in right knee: Secondary | ICD-10-CM | POA: Diagnosis not present

## 2018-10-11 DIAGNOSIS — M25561 Pain in right knee: Secondary | ICD-10-CM | POA: Diagnosis not present

## 2018-10-14 DIAGNOSIS — M25561 Pain in right knee: Secondary | ICD-10-CM | POA: Diagnosis not present

## 2018-10-16 DIAGNOSIS — M25561 Pain in right knee: Secondary | ICD-10-CM | POA: Diagnosis not present

## 2018-10-18 DIAGNOSIS — M25561 Pain in right knee: Secondary | ICD-10-CM | POA: Diagnosis not present

## 2018-10-21 DIAGNOSIS — M25561 Pain in right knee: Secondary | ICD-10-CM | POA: Diagnosis not present

## 2019-01-09 DIAGNOSIS — E1169 Type 2 diabetes mellitus with other specified complication: Secondary | ICD-10-CM | POA: Diagnosis not present

## 2019-01-09 DIAGNOSIS — I10 Essential (primary) hypertension: Secondary | ICD-10-CM | POA: Diagnosis not present

## 2019-01-09 DIAGNOSIS — E7849 Other hyperlipidemia: Secondary | ICD-10-CM | POA: Diagnosis not present

## 2019-01-14 DIAGNOSIS — R82998 Other abnormal findings in urine: Secondary | ICD-10-CM | POA: Diagnosis not present

## 2019-01-14 DIAGNOSIS — I10 Essential (primary) hypertension: Secondary | ICD-10-CM | POA: Diagnosis not present

## 2019-01-16 DIAGNOSIS — E785 Hyperlipidemia, unspecified: Secondary | ICD-10-CM | POA: Diagnosis not present

## 2019-01-16 DIAGNOSIS — R809 Proteinuria, unspecified: Secondary | ICD-10-CM | POA: Diagnosis not present

## 2019-01-16 DIAGNOSIS — Z1331 Encounter for screening for depression: Secondary | ICD-10-CM | POA: Diagnosis not present

## 2019-01-16 DIAGNOSIS — Z0000 Encounter for general adult medical examination without abnormal findings: Secondary | ICD-10-CM | POA: Diagnosis not present

## 2019-01-16 DIAGNOSIS — G4734 Idiopathic sleep related nonobstructive alveolar hypoventilation: Secondary | ICD-10-CM | POA: Diagnosis not present

## 2019-01-16 DIAGNOSIS — I10 Essential (primary) hypertension: Secondary | ICD-10-CM | POA: Diagnosis not present

## 2019-02-28 DIAGNOSIS — Z1211 Encounter for screening for malignant neoplasm of colon: Secondary | ICD-10-CM | POA: Diagnosis not present

## 2019-02-28 DIAGNOSIS — Z1212 Encounter for screening for malignant neoplasm of rectum: Secondary | ICD-10-CM | POA: Diagnosis not present

## 2019-03-17 DIAGNOSIS — Z124 Encounter for screening for malignant neoplasm of cervix: Secondary | ICD-10-CM | POA: Diagnosis not present

## 2019-03-17 DIAGNOSIS — Z130 Encounter for screening for diseases of the blood and blood-forming organs and certain disorders involving the immune mechanism: Secondary | ICD-10-CM | POA: Diagnosis not present

## 2019-03-17 DIAGNOSIS — Z1231 Encounter for screening mammogram for malignant neoplasm of breast: Secondary | ICD-10-CM | POA: Diagnosis not present

## 2019-03-17 DIAGNOSIS — Z01419 Encounter for gynecological examination (general) (routine) without abnormal findings: Secondary | ICD-10-CM | POA: Diagnosis not present

## 2019-03-17 DIAGNOSIS — N760 Acute vaginitis: Secondary | ICD-10-CM | POA: Diagnosis not present

## 2019-03-17 DIAGNOSIS — Z1151 Encounter for screening for human papillomavirus (HPV): Secondary | ICD-10-CM | POA: Diagnosis not present

## 2019-04-16 DIAGNOSIS — E1169 Type 2 diabetes mellitus with other specified complication: Secondary | ICD-10-CM | POA: Diagnosis not present

## 2019-04-18 DIAGNOSIS — I10 Essential (primary) hypertension: Secondary | ICD-10-CM | POA: Diagnosis not present

## 2019-04-18 DIAGNOSIS — E785 Hyperlipidemia, unspecified: Secondary | ICD-10-CM | POA: Diagnosis not present

## 2019-04-18 DIAGNOSIS — E1169 Type 2 diabetes mellitus with other specified complication: Secondary | ICD-10-CM | POA: Diagnosis not present

## 2019-04-18 DIAGNOSIS — E1121 Type 2 diabetes mellitus with diabetic nephropathy: Secondary | ICD-10-CM | POA: Diagnosis not present

## 2019-07-07 DIAGNOSIS — E1169 Type 2 diabetes mellitus with other specified complication: Secondary | ICD-10-CM | POA: Diagnosis not present

## 2019-07-07 DIAGNOSIS — I10 Essential (primary) hypertension: Secondary | ICD-10-CM | POA: Diagnosis not present

## 2019-07-10 DIAGNOSIS — E1169 Type 2 diabetes mellitus with other specified complication: Secondary | ICD-10-CM | POA: Diagnosis not present

## 2019-07-10 DIAGNOSIS — E7849 Other hyperlipidemia: Secondary | ICD-10-CM | POA: Diagnosis not present

## 2019-08-05 IMAGING — NM NM BONE 3 PHASE
7 series · 17 of 17 positions shown · non-contrast
Comparison: None.

CLINICAL DATA: The patient had an initial right knee replacement 4
years ago with a revision in August 2017. Fall November 24, 2017 with
pain since that time. Evaluate for loosening/pain.

EXAM:
NUCLEAR MEDICINE 3-PHASE BONE SCAN
TECHNIQUE: Radionuclide angiographic images, immediate static blood pool
images, and 3-hour delayed static images were obtained of the knee
after intravenous injection of radiopharmaceutical.
RADIOPHARMACEUTICALS:  21.8 mCi Zc-88m MDP IV

[Series 1: flow · 2.07mm/px · 6 of 48 frames shown (1 of 2)]
[frame 5/48]
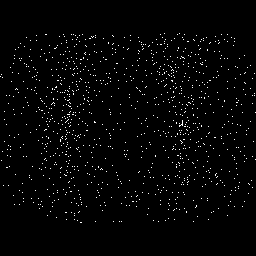
[frame 13/48]
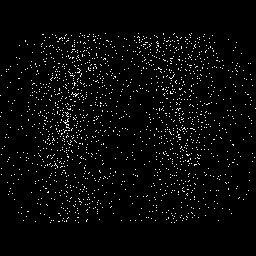
[frame 21/48]
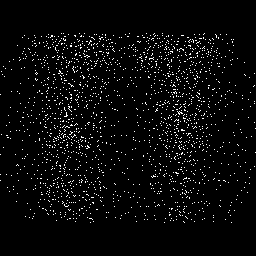
[frame 29/48]
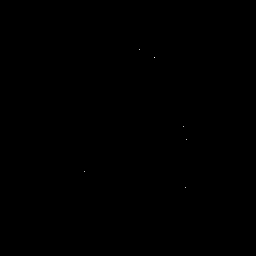
[frame 37/48]
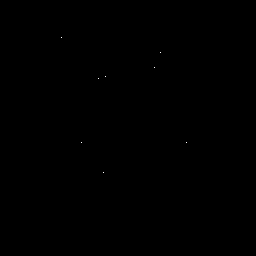
[frame 45/48]
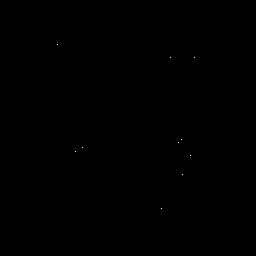

[Series 1: flow · 2.07mm/px · 6 of 48 frames shown (2 of 2)]
[frame 5/48]
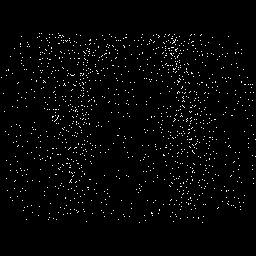
[frame 13/48]
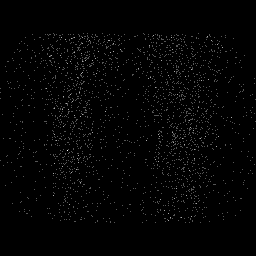
[frame 21/48]
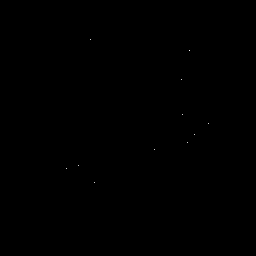
[frame 29/48]
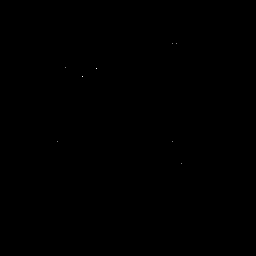
[frame 37/48]
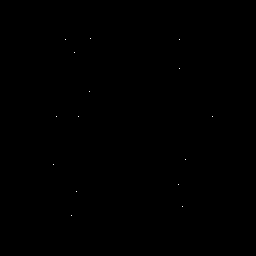
[frame 45/48]
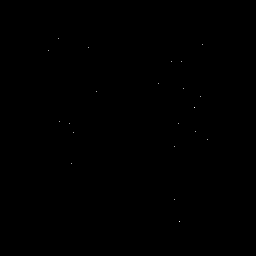

[Series 2: blood pool · 2.07mm/px · 1 of 1 slices shown (1 of 3)]
[im 1/1  full-range]
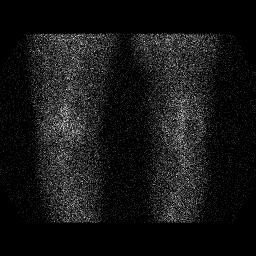

[Series 2: blood pool · 2.07mm/px · 1 of 1 slices shown (2 of 3)]
[im 1/1  full-range]
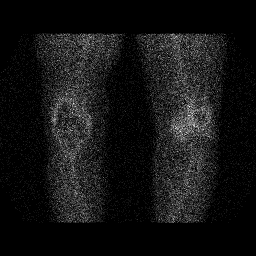

[Series 3: lat bp · 2.07mm/px · 1 of 1 slices shown (1 of 2)]
[im 1/1  full-range]
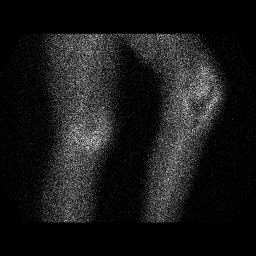

[Series 3: lat bp · 2.07mm/px · 1 of 1 slices shown (2 of 2)]
[im 1/1  full-range]
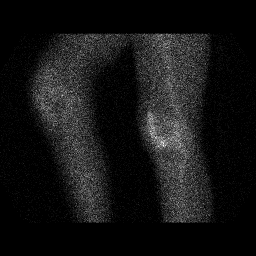

[Series 5: blood pool · 2.07mm/px · 1 of 1 slices shown (3 of 3)]
[im 1/1]
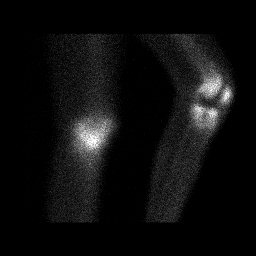

[17 of 17 positions shown; findings below may reference images not displayed]

FINDINGS: Vascular phase: Normal vascular blood flow in both knees.

Blood pool phase: Moderate increased blood pool uptake diffusely
around the left knee. Mild uptake around the right knee is as
expected after replacement.

Delayed phase: Mild delayed uptake adjacent to the femoral and
tibial components of the right knee replacement. The uptake is mild
and diffuse rather than focal. Diffuse uptake in the left knee,
particularly in the medial compartment.
IMPRESSION: 1. Degenerative changes in the left knee.
2. No convincing evidence of right knee replacement loosening. No
evidence of fracture.

## 2020-02-09 ENCOUNTER — Ambulatory Visit (AMBULATORY_SURGERY_CENTER): Payer: Self-pay | Admitting: *Deleted

## 2020-02-09 ENCOUNTER — Other Ambulatory Visit: Payer: Self-pay

## 2020-02-09 VITALS — Ht 67.0 in | Wt 212.0 lb

## 2020-02-09 DIAGNOSIS — Z1211 Encounter for screening for malignant neoplasm of colon: Secondary | ICD-10-CM

## 2020-02-09 MED ORDER — SUTAB 1479-225-188 MG PO TABS: 24.0000 | ORAL_TABLET | ORAL | 0 refills | Status: DC

## 2020-02-09 NOTE — Progress Notes (Signed)
12-24-2019 covid vaccines completed   No egg or soy allergy known to patient   issues with past sedation with any surgeries  or procedures PONV , no intubation problems  No diet pills per patient No home 02 use per patient  No blood thinners per patient  Pt states issues with constipation that alternates with diarrhea- she states has hard BM's , will add Mag citrate per Dr Blanch Media choice for prep     No A fib or A flutter  EMMI video sent to pt's e mail   Due to the COVID-19 pandemic we are asking patients to follow these guidelines. Please only bring one care partner. Please be aware that your care partner may wait in the car in the parking lot or if they feel like they will be too hot to wait in the car, they may wait in the lobby on the 4th floor. All care partners are required to wear a mask the entire time (we do not have any that we can provide them), they need to practice social distancing, and we will do a Covid check for all patient's and care partners when you arrive. Also we will check their temperature and your temperature. If the care partner waits in their car they need to stay in the parking lot the entire time and we will call them on their cell phone when the patient is ready for discharge so they can bring the car to the front of the building. Also all patient's will need to wear a mask into building.

## 2020-02-19 ENCOUNTER — Encounter: Payer: Self-pay | Admitting: Internal Medicine

## 2020-02-20 ENCOUNTER — Encounter: Payer: 59 | Admitting: Internal Medicine

## 2020-03-01 ENCOUNTER — Other Ambulatory Visit: Payer: Self-pay

## 2020-03-01 ENCOUNTER — Encounter: Payer: Self-pay | Admitting: Internal Medicine

## 2020-03-01 ENCOUNTER — Ambulatory Visit (AMBULATORY_SURGERY_CENTER): Payer: 59 | Admitting: Internal Medicine

## 2020-03-01 VITALS — BP 107/52 | HR 69 | Temp 97.1°F | Resp 16 | Ht 67.0 in | Wt 212.0 lb

## 2020-03-01 DIAGNOSIS — K635 Polyp of colon: Secondary | ICD-10-CM

## 2020-03-01 DIAGNOSIS — Z1211 Encounter for screening for malignant neoplasm of colon: Secondary | ICD-10-CM | POA: Diagnosis present

## 2020-03-01 DIAGNOSIS — D125 Benign neoplasm of sigmoid colon: Secondary | ICD-10-CM

## 2020-03-01 MED ORDER — SODIUM CHLORIDE 0.9 % IV SOLN
500.0000 mL | Freq: Once | INTRAVENOUS | Status: DC
Start: 2020-03-01 — End: 2020-03-01

## 2020-03-01 NOTE — Patient Instructions (Signed)
Impression/Recommendations:  Polyp handout given to patient. Hemorrhoid handout given to patient.  Repeat colonoscopy in 10 years for surveillance.  Resume previous diet. Continue present medications. Await pathology results.  YOU HAD AN ENDOSCOPIC PROCEDURE TODAY AT [facility]:   Refer to the procedure report that was given to you for any specific questions about what was found during the examination.  If the procedure report does not answer your questions, please call your gastroenterologist to clarify.  If you requested that your care partner not be given the details of your procedure findings, then the procedure report has been included in a sealed envelope for you to review at your convenience later.  YOU SHOULD EXPECT: Some feelings of bloating in the abdomen. Passage of more gas than usual.  Walking can help get rid of the air that was put into your GI tract during the procedure and reduce the bloating. If you had a lower endoscopy (such as a colonoscopy or flexible sigmoidoscopy) you may notice spotting of blood in your stool or on the toilet paper. If you underwent a bowel prep for your procedure, you may not have a normal bowel movement for a few days.  Please Note:  You might notice some irritation and congestion in your nose or some drainage.  This is from the oxygen used during your procedure.  There is no need for concern and it should clear up in a day or so.  SYMPTOMS TO REPORT IMMEDIATELY:   Following lower endoscopy (colonoscopy or flexible sigmoidoscopy):  Excessive amounts of blood in the stool  Significant tenderness or worsening of abdominal pains  Swelling of the abdomen that is new, acute  Fever of 100F or higher  For urgent or emergent issues, a gastroenterologist can be reached at any hour by calling [phone]. Do not use MyChart messaging for urgent concerns.    DIET:  We do recommend a small meal at first, but then you may proceed to  your regular diet.  Drink plenty of fluids but you should avoid alcoholic beverages for 24 hours.  ACTIVITY:  You should plan to take it easy for the rest of today and you should NOT DRIVE or use heavy machinery until tomorrow (because of the sedation medicines used during the test).    FOLLOW UP: Our staff will call the number listed on your records 48-72 hours following your procedure to check on you and address any questions or concerns that you may have regarding the information given to you following your procedure. If we do not reach you, we will leave a message.  We will attempt to reach you two times.  During this call, we will ask if you have developed any symptoms of COVID 19. If you develop any symptoms (ie: fever, flu-like symptoms, shortness of breath, cough etc.) before then, please call [phone].  If you test positive for Covid 19 in the 2 weeks post procedure, please call and report this information to [place].    If any biopsies were taken you will be contacted by phone or by letter within the next 1-3 weeks.  Please call us at [phone] if you have not heard about the biopsies in 3 weeks.    SIGNATURES/CONFIDENTIALITY: You and/or your care partner have signed paperwork which will be entered into your electronic medical record.  These signatures attest to the fact that that the information above on your After Visit Summary has been reviewed and is understood.  Full responsibility of  the confidentiality of this discharge information lies with you and/or your care-partner. 

## 2020-03-01 NOTE — Op Note (Signed)
[place] Patient Name: Terri [name] Procedure Date: 03/01/2020 1:19 PM MRN: [id number] Endoscopist: Docia Chuck. Henrene Pastor , MD Age: 49 Referring MD:  Date of Birth: [birth year] Gender: Female Account #: [id number] Procedure:                Colonoscopy with cold snare polypectomy x 1 Indications:              Screening for colorectal malignant neoplasm Medicines:                Monitored Anesthesia Care Procedure:                Pre-Anesthesia Assessment:                           - Prior to the procedure, a History and Physical                            was performed, and patient medications and                            allergies were reviewed. The patient's tolerance of                            previous anesthesia was also reviewed. The risks                            and benefits of the procedure and the sedation                            options and risks were discussed with the patient.                            All questions were answered, and informed consent                            was obtained. Prior Anticoagulants: The patient has                            taken no previous anticoagulant or antiplatelet                            agents. ASA Grade Assessment: II - A patient with                            mild systemic disease. After reviewing the risks                            and benefits, the patient was deemed in                            satisfactory condition to undergo the procedure.                           After obtaining informed consent, the colonoscope  was passed under direct vision. Throughout the                            procedure, the patient's blood pressure, pulse, and                            oxygen saturations were monitored continuously. The                            Colonoscope was introduced through the anus and                            advanced to the the cecum, identified by                             appendiceal orifice and ileocecal valve. The                            ileocecal valve, appendiceal orifice, and rectum                            were photographed. The quality of the bowel                            preparation was excellent. The colonoscopy was                            performed without difficulty. The patient tolerated                            the procedure well. The bowel preparation used was                            SUPREP via split dose instruction. Scope In: 1:26:55 PM Scope Out: 1:45:18 PM Scope Withdrawal Time: 0 hours 15 minutes 13 seconds  Total Procedure Duration: 0 hours 18 minutes 23 seconds  Findings:                 A 2 mm polyp was found in the sigmoid colon. The                            polyp was removed with a cold snare. Resection and                            retrieval were complete.                           Internal hemorrhoids were found during                            retroflexion. The hemorrhoids were moderate.                           The exam was otherwise without abnormality on  direct and retroflexion views. Complications:            No immediate complications. Estimated blood loss:                            None. Estimated Blood Loss:     Estimated blood loss: none. Impression:               - One 2 mm polyp in the sigmoid colon, removed with                            a cold snare. Resected and retrieved.                           - Internal hemorrhoids.                           - The examination was otherwise normal on direct                            and retroflexion views. Recommendation:           - Repeat colonoscopy in 10 years for surveillance.                           - Patient has a contact number available for                            emergencies. The signs and symptoms of potential                            delayed complications were discussed with the                             patient. Return to normal activities tomorrow.                            Written discharge instructions were provided to the                            patient.                           - Resume previous diet.                           - Continue present medications.                           - Await pathology results. Docia Chuck. Henrene Pastor, MD 03/01/2020 1:49:24 PM This report has been signed electronically.

## 2020-03-01 NOTE — Progress Notes (Signed)
Pt's states no medical or surgical changes since previsit or office visit. ° °Vs cw °

## 2020-03-01 NOTE — Progress Notes (Signed)
Report to PACU, RN, vss, BBS= Clear.  

## 2020-03-03 ENCOUNTER — Telehealth: Payer: Self-pay | Admitting: *Deleted

## 2020-03-03 NOTE — Telephone Encounter (Signed)
°  Follow up Call-  Call back number 03/01/2020  Post procedure Call Back phone  # [phone]  Permission to leave phone message Yes  Some recent data might be hidden     Patient questions:  Do you have a fever, pain , or abdominal swelling? No. Pain Score  0 *  Have you tolerated food without any problems? Yes.    Have you been able to return to your normal activities? Yes.    Do you have any questions about your discharge instructions: Diet   No. Medications  No. Follow up visit  No.  Do you have questions or concerns about your Care? No.  Actions: * If pain score is 4 or above: 1. No action needed, pain <4.Have you developed a fever since your procedure? no  2.   Have you had an respiratory symptoms (SOB or cough) since your procedure? no  3.   Have you tested positive for COVID 19 since your procedure no  4.   Have you had any family members/close contacts diagnosed with the COVID 19 since your procedure?  no   If yes to any of these questions please route to Joylene John, RN and Erenest Rasher, RN

## 2020-03-04 ENCOUNTER — Encounter: Payer: Self-pay | Admitting: Internal Medicine

## 2020-05-27 ENCOUNTER — Telehealth (HOSPITAL_COMMUNITY): Payer: Self-pay | Admitting: Family

## 2020-05-27 NOTE — Telephone Encounter (Signed)
Called to discuss with Terri [name] about Covid symptoms and the use of casirivimab/imdevimab, a combination monoclonal antibody infusion for those with mild to moderate Covid symptoms and at a high risk of hospitalization.     Based on chart review, pt is qualified for this infusion due to co-morbid conditions and/or a member of an at-risk group, however unable to reach patient. VM left.     Darlisa Spruiell,NP

## 2020-05-28 ENCOUNTER — Telehealth: Payer: Self-pay | Admitting: Oncology

## 2020-05-28 ENCOUNTER — Other Ambulatory Visit: Payer: Self-pay | Admitting: Oncology

## 2020-05-28 DIAGNOSIS — U071 COVID-19: Secondary | ICD-10-CM

## 2020-05-28 NOTE — Telephone Encounter (Signed)
I connected by phone with  Terri [name]  to discuss the potential use of an new treatment for mild to moderate COVID-19 viral infection in non-hospitalized patients.   This patient is a age/sex that meets the FDA criteria for Emergency Use Authorization of casirivimab\imdevimab.  Has a (+) direct SARS-CoV-2 viral test result 1. Has mild or moderate COVID-19  2. Is ? 49 years of age and weighs ? 40 kg 3. Is NOT hospitalized due to COVID-19 4. Is NOT requiring oxygen therapy or requiring an increase in baseline oxygen flow rate due to COVID-19 5. Is within 10 days of symptom onset 6. Has at least one of the high risk factor(s) for progression to severe COVID-19 and/or hospitalization as defined in EUA. ? Specific high risk criteria : Obesity    Symptom onset  05/26/2020   I have spoken and communicated the following to the patient or parent/caregiver:   1. FDA has authorized the emergency use of casirivimab\imdevimab for the treatment of mild to moderate COVID-19 in adults and pediatric patients with positive results of direct SARS-CoV-2 viral testing who are 50 years of age and older weighing at least 40 kg, and who are at high risk for progressing to severe COVID-19 and/or hospitalization.   2. The significant known and potential risks and benefits of casirivimab\imdevimab, and the extent to which such potential risks and benefits are unknown.   3. Information on available alternative treatments and the risks and benefits of those alternatives, including clinical trials.   4. Patients treated with casirivimab\imdevimab should continue to self-isolate and use infection control measures (e.g., wear mask, isolate, social distance, avoid sharing personal items, clean and disinfect "high touch" surfaces, and frequent handwashing) according to CDC guidelines.    5. The patient or parent/caregiver has the option to accept or refuse casirivimab\imdevimab .   After reviewing this information with the  patient, The patient agreed to proceed with receiving casirivimab\imdevimab infusion and will be provided a copy of the Fact sheet prior to receiving the infusion.Rulon Abide, AGNP-C [phone] ([place])

## 2020-05-29 ENCOUNTER — Other Ambulatory Visit (HOSPITAL_COMMUNITY): Payer: Self-pay

## 2020-05-29 ENCOUNTER — Ambulatory Visit (HOSPITAL_COMMUNITY)
Admission: RE | Admit: 2020-05-29 | Discharge: 2020-05-29 | Disposition: A | Discharge status: Home / Self Care | Payer: 59 | Source: Ambulatory Visit | Attending: Pulmonary Disease | Admitting: Pulmonary Disease

## 2020-05-29 DIAGNOSIS — U071 COVID-19: Secondary | ICD-10-CM | POA: Diagnosis not present

## 2020-05-29 MED ORDER — DIPHENHYDRAMINE HCL 50 MG/ML IJ SOLN: 50.0000 mg | Freq: Once | INTRAMUSCULAR | Status: DC | PRN

## 2020-05-29 MED ORDER — ACETAMINOPHEN 325 MG PO TABS
650.0000 mg | ORAL_TABLET | Freq: Four times a day (QID) | ORAL | Status: DC | PRN
  Administered 2020-05-29: 650 mg via ORAL
  Filled 2020-05-29: qty 2

## 2020-05-29 MED ORDER — EPINEPHRINE 0.3 MG/0.3ML IJ SOAJ: 0.3000 mg | Freq: Once | INTRAMUSCULAR | Status: DC | PRN

## 2020-05-29 MED ORDER — ALBUTEROL SULFATE HFA 108 (90 BASE) MCG/ACT IN AERS: 2.0000 | INHALATION_SPRAY | Freq: Once | RESPIRATORY_TRACT | Status: DC | PRN

## 2020-05-29 MED ORDER — SODIUM CHLORIDE 0.9 % IV SOLN
1200.0000 mg | Freq: Once | INTRAVENOUS | Status: AC
  Administered 2020-05-29: 1200 mg via INTRAVENOUS
  Filled 2020-05-29: qty 10

## 2020-05-29 MED ORDER — SODIUM CHLORIDE 0.9 % IV SOLN: INTRAVENOUS | Status: DC | PRN

## 2020-05-29 MED ORDER — METHYLPREDNISOLONE SODIUM SUCC 125 MG IJ SOLR: 125.0000 mg | Freq: Once | INTRAMUSCULAR | Status: DC | PRN

## 2020-05-29 MED ORDER — FAMOTIDINE IN NACL 20-0.9 MG/50ML-% IV SOLN: 20.0000 mg | Freq: Once | INTRAVENOUS | Status: DC | PRN

## 2020-05-29 NOTE — Discharge Instructions (Signed)

## 2020-05-29 NOTE — Progress Notes (Signed)
  Diagnosis: COVID-19  Physician: Dr. Joya Gaskins  Procedure: [facility] Infusion [facility] Med: casirivimab\imdevimab infusion - Provided patient with casirivimab\imdevimab fact sheet for patients, parents and caregivers prior to infusion.  Complications: No immediate complications noted.  Discharge: Discharged home   Tia Masker 05/29/2020

## 2020-06-08 ENCOUNTER — Other Ambulatory Visit: Payer: 59

## 2020-06-08 DIAGNOSIS — Z20822 Contact with and (suspected) exposure to covid-19: Secondary | ICD-10-CM

## 2020-06-10 LAB — NOVEL CORONAVIRUS, NAA: SARS-CoV-2, NAA: NOT DETECTED

## 2020-06-10 LAB — SARS-COV-2, NAA 2 DAY TAT

## 2022-11-29 ENCOUNTER — Other Ambulatory Visit (HOSPITAL_COMMUNITY): Payer: Self-pay | Admitting: Orthopedic Surgery

## 2022-11-29 DIAGNOSIS — Z96651 Presence of right artificial knee joint: Secondary | ICD-10-CM

## 2022-12-12 ENCOUNTER — Ambulatory Visit (HOSPITAL_COMMUNITY)
Admission: RE | Admit: 2022-12-12 | Discharge: 2022-12-12 | Disposition: A | Discharge status: Home / Self Care | Payer: 59 | Source: Ambulatory Visit | Attending: Orthopedic Surgery | Admitting: Orthopedic Surgery

## 2022-12-12 DIAGNOSIS — Z96651 Presence of right artificial knee joint: Secondary | ICD-10-CM | POA: Diagnosis not present

## 2022-12-12 MED ORDER — TECHNETIUM TC 99M MEDRONATE IV KIT
20.9000 | PACK | Freq: Once | INTRAVENOUS | Status: AC | PRN
  Administered 2022-12-12: 20.9 via INTRAVENOUS

## 2023-01-03 ENCOUNTER — Other Ambulatory Visit: Payer: Self-pay | Admitting: Family Medicine

## 2023-01-03 DIAGNOSIS — N644 Mastodynia: Secondary | ICD-10-CM

## 2023-01-16 ENCOUNTER — Ambulatory Visit
Admission: RE | Admit: 2023-01-16 | Discharge: 2023-01-16 | Disposition: A | Discharge status: Home / Self Care | Payer: 59 | Source: Ambulatory Visit | Attending: Family Medicine | Admitting: Family Medicine

## 2023-01-16 ENCOUNTER — Other Ambulatory Visit: Payer: Self-pay | Admitting: Family Medicine

## 2023-01-16 DIAGNOSIS — N644 Mastodynia: Secondary | ICD-10-CM

## 2024-03-07 DIAGNOSIS — E1121 Type 2 diabetes mellitus with diabetic nephropathy: Secondary | ICD-10-CM | POA: Diagnosis not present

## 2024-03-07 DIAGNOSIS — R82998 Other abnormal findings in urine: Secondary | ICD-10-CM | POA: Diagnosis not present

## 2024-03-07 DIAGNOSIS — R5383 Other fatigue: Secondary | ICD-10-CM | POA: Diagnosis not present

## 2024-03-07 DIAGNOSIS — I10 Essential (primary) hypertension: Secondary | ICD-10-CM | POA: Diagnosis not present

## 2024-06-11 DIAGNOSIS — Z794 Long term (current) use of insulin: Secondary | ICD-10-CM | POA: Diagnosis not present

## 2024-06-11 DIAGNOSIS — Z7189 Other specified counseling: Secondary | ICD-10-CM | POA: Diagnosis not present

## 2024-06-11 DIAGNOSIS — E1121 Type 2 diabetes mellitus with diabetic nephropathy: Secondary | ICD-10-CM | POA: Diagnosis not present

## 2024-06-11 DIAGNOSIS — I10 Essential (primary) hypertension: Secondary | ICD-10-CM | POA: Diagnosis not present

## 2024-06-11 DIAGNOSIS — E669 Obesity, unspecified: Secondary | ICD-10-CM | POA: Diagnosis not present

## 2024-06-11 DIAGNOSIS — E785 Hyperlipidemia, unspecified: Secondary | ICD-10-CM | POA: Diagnosis not present

## 2024-08-19 DIAGNOSIS — N951 Menopausal and female climacteric states: Secondary | ICD-10-CM | POA: Diagnosis not present

## 2024-08-19 DIAGNOSIS — Z1151 Encounter for screening for human papillomavirus (HPV): Secondary | ICD-10-CM | POA: Diagnosis not present

## 2024-08-19 DIAGNOSIS — Z1231 Encounter for screening mammogram for malignant neoplasm of breast: Secondary | ICD-10-CM | POA: Diagnosis not present

## 2024-08-19 DIAGNOSIS — Z124 Encounter for screening for malignant neoplasm of cervix: Secondary | ICD-10-CM | POA: Diagnosis not present

## 2024-08-19 DIAGNOSIS — Z1389 Encounter for screening for other disorder: Secondary | ICD-10-CM | POA: Diagnosis not present

## 2024-08-19 DIAGNOSIS — Z01419 Encounter for gynecological examination (general) (routine) without abnormal findings: Secondary | ICD-10-CM | POA: Diagnosis not present
